# Patient Record
Sex: Male | Born: 1950 | Hispanic: Yes | Marital: Married | Smoking: Never smoker
Health system: Southern US, Community
[De-identification: ages and names within clinical notes are randomized; demographics above are authoritative.]

## PROBLEM LIST (undated history)

## (undated) DIAGNOSIS — C61 Malignant neoplasm of prostate: Secondary | ICD-10-CM

## (undated) DIAGNOSIS — I10 Essential (primary) hypertension: Secondary | ICD-10-CM

## (undated) DIAGNOSIS — I639 Cerebral infarction, unspecified: Secondary | ICD-10-CM

## (undated) DIAGNOSIS — E7800 Pure hypercholesterolemia, unspecified: Secondary | ICD-10-CM

## (undated) DIAGNOSIS — K635 Polyp of colon: Secondary | ICD-10-CM

## (undated) DIAGNOSIS — G935 Compression of brain: Secondary | ICD-10-CM

## (undated) DIAGNOSIS — K5790 Diverticulosis of intestine, part unspecified, without perforation or abscess without bleeding: Secondary | ICD-10-CM

## (undated) HISTORY — DX: Compression of brain: G93.5

## (undated) HISTORY — DX: Pure hypercholesterolemia, unspecified: E78.00

## (undated) HISTORY — DX: Diverticulosis of intestine, part unspecified, without perforation or abscess without bleeding: K57.90

## (undated) HISTORY — DX: Essential (primary) hypertension: I10

## (undated) HISTORY — DX: Polyp of colon: K63.5

## (undated) HISTORY — PX: POLYPECTOMY: SHX149

## (undated) HISTORY — PX: INGUINAL HERNIA REPAIR: SUR1180

---

## 2021-04-03 ENCOUNTER — Other Ambulatory Visit: Payer: Self-pay | Admitting: Otolaryngology

## 2021-04-03 DIAGNOSIS — J339 Nasal polyp, unspecified: Secondary | ICD-10-CM

## 2021-04-21 ENCOUNTER — Other Ambulatory Visit: Payer: Self-pay | Admitting: Urology

## 2021-04-21 ENCOUNTER — Other Ambulatory Visit: Payer: Self-pay | Admitting: Family Medicine

## 2021-04-21 ENCOUNTER — Other Ambulatory Visit (HOSPITAL_COMMUNITY): Payer: Self-pay | Admitting: Urology

## 2021-04-21 DIAGNOSIS — C61 Malignant neoplasm of prostate: Secondary | ICD-10-CM

## 2021-04-24 ENCOUNTER — Ambulatory Visit
Admission: RE | Admit: 2021-04-24 | Discharge: 2021-04-24 | Disposition: A | Discharge status: Home / Self Care | Payer: Self-pay | Source: Ambulatory Visit | Attending: Otolaryngology | Admitting: Otolaryngology

## 2021-04-24 DIAGNOSIS — J339 Nasal polyp, unspecified: Secondary | ICD-10-CM

## 2021-04-24 IMAGING — CT CT MAXILLOFACIAL W/O CM
1 series · 15 of 30 positions shown, 19 images · non-contrast
Comparison: None.

CLINICAL DATA: Nasal polyps, history of polyps surgery

EXAM:
CT MAXILLOFACIAL WITHOUT CONTRAST
TECHNIQUE: Multidetector CT images of the paranasal sinuses were obtained using
the standard protocol without intravenous contrast.

[Series 6: soft tissue · axial · 0.40mm/px · z∈[+170,+295]mm · 15 of 135 slices shown, 19 images]
[im 5/135  brain]
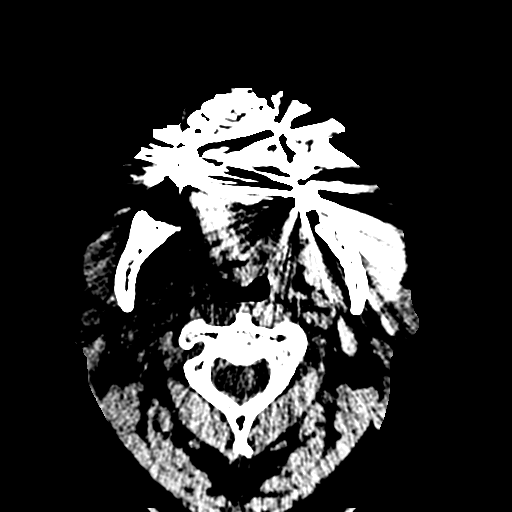
[im 5/135  bone]
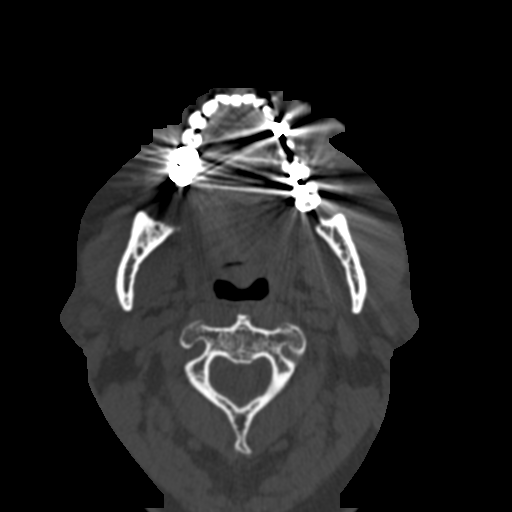
[im 14/135  bone]
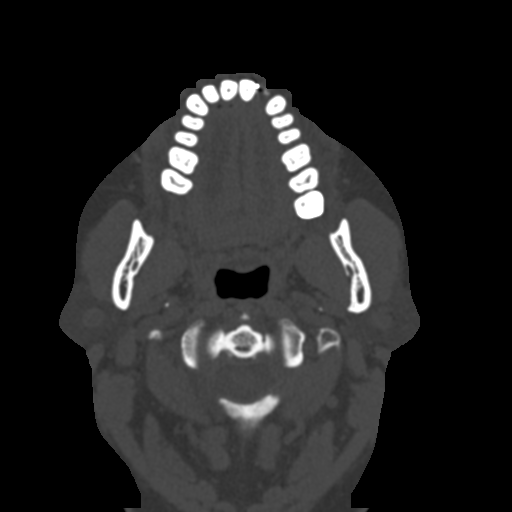
[im 24/135  bone]
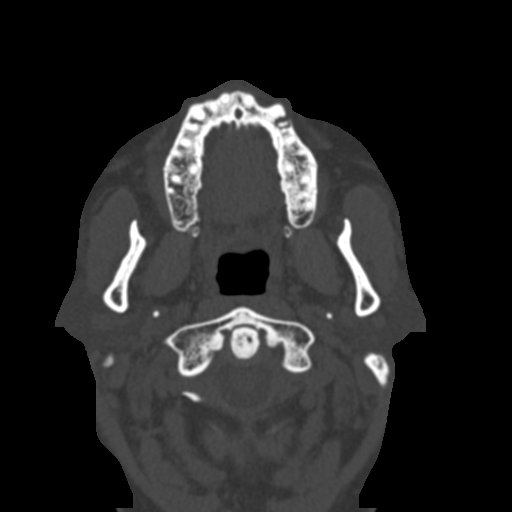
[im 33/135  bone]
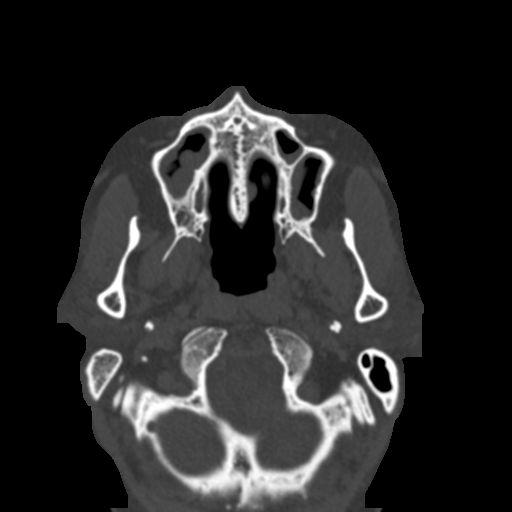
[im 42/135  brain]
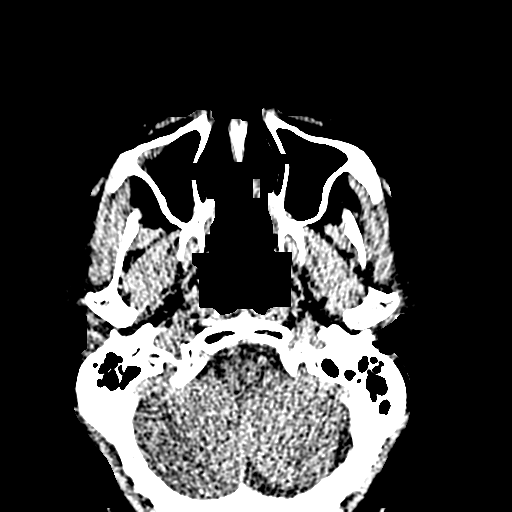
[im 42/135  bone]
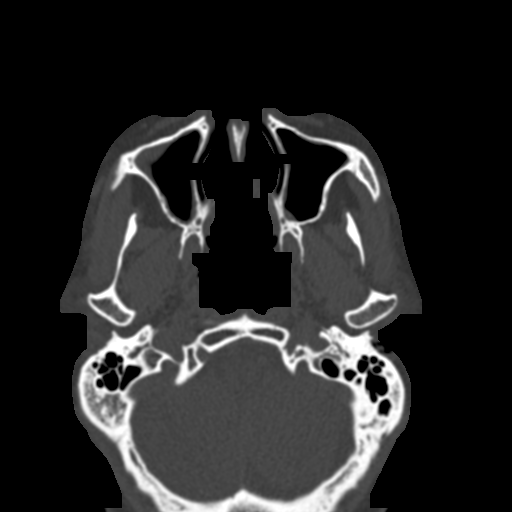
[im 51/135  bone]
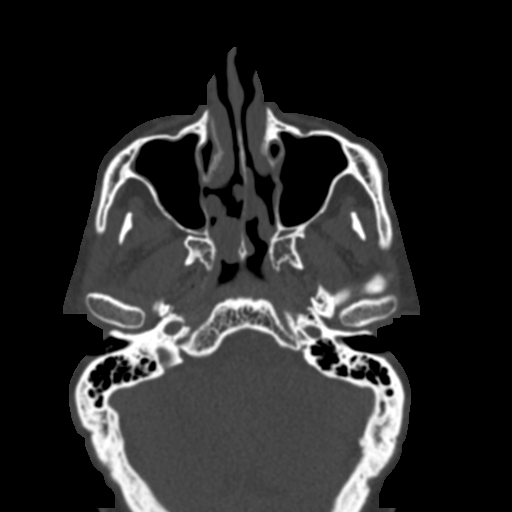
[im 61/135  bone]
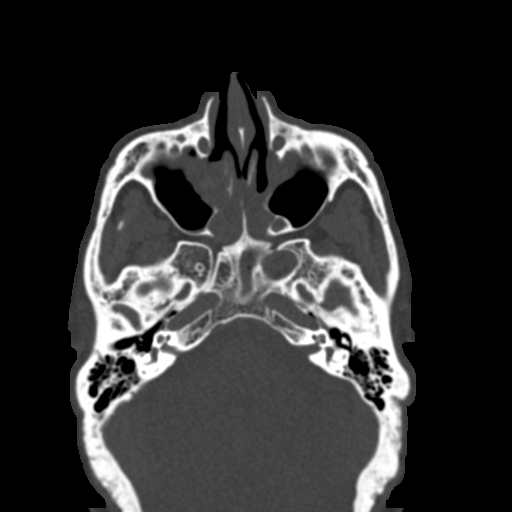
[im 70/135  bone]
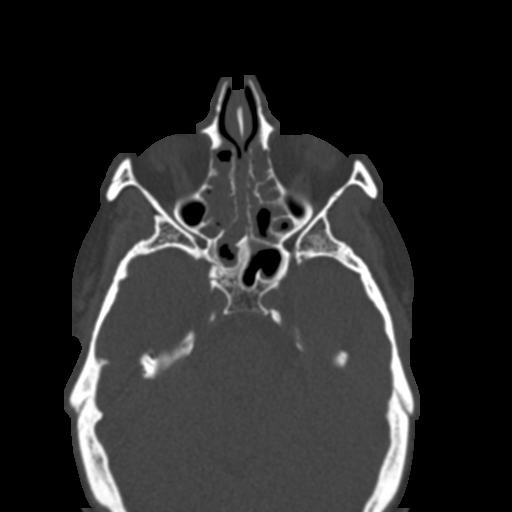
[im 74/135  brain]
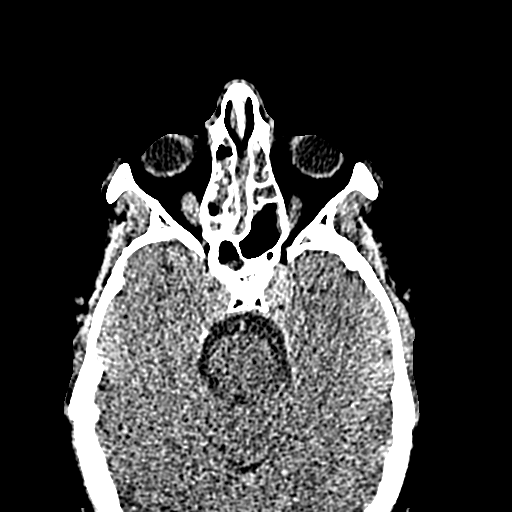
[im 74/135  bone]
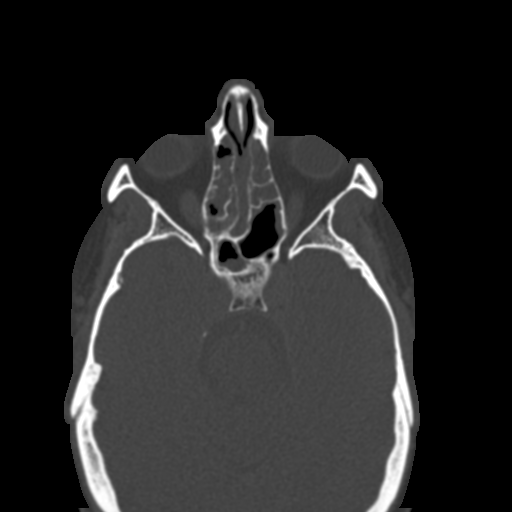
[im 84/135  bone]
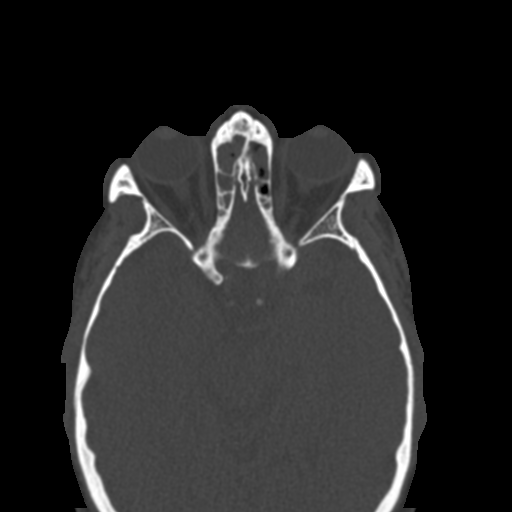
[im 93/135  bone]
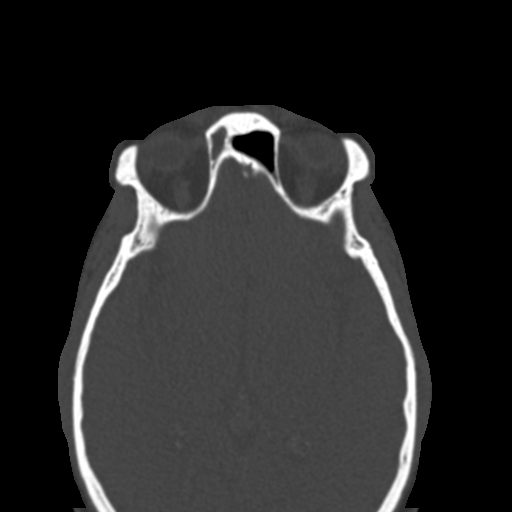
[im 102/135  bone]
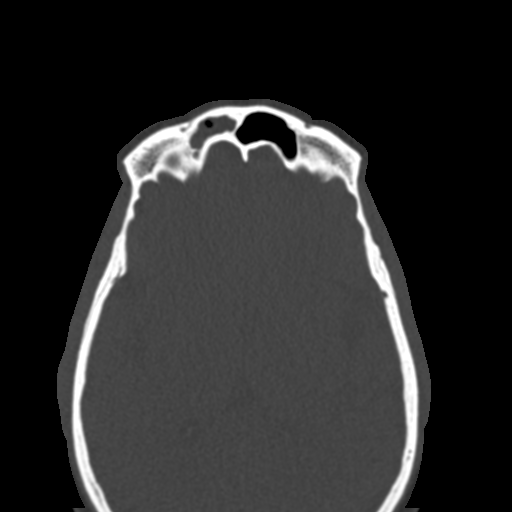
[im 111/135  brain]
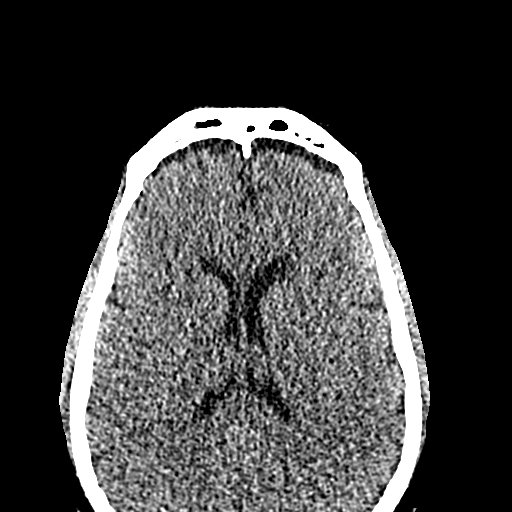
[im 111/135  bone]
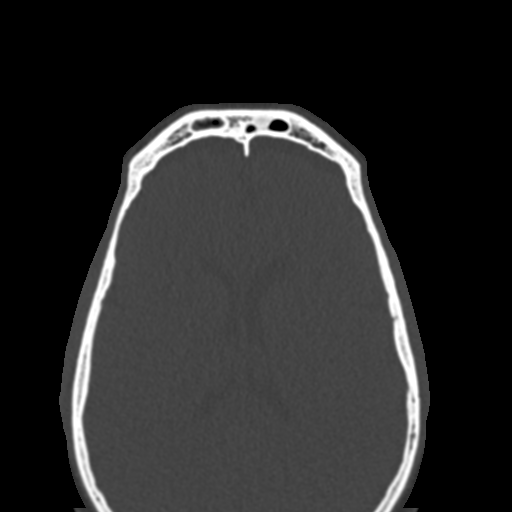
[im 121/135  bone]
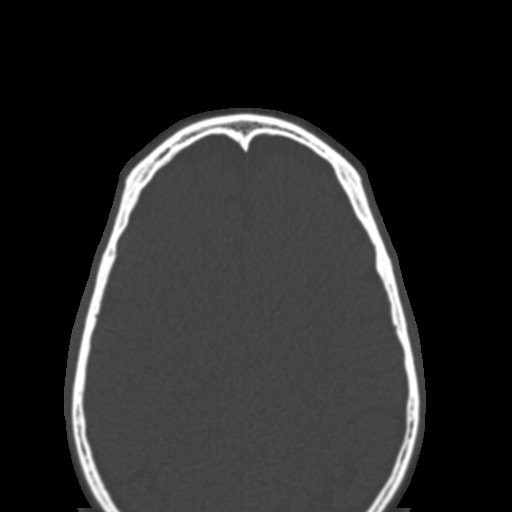
[im 130/135  bone]
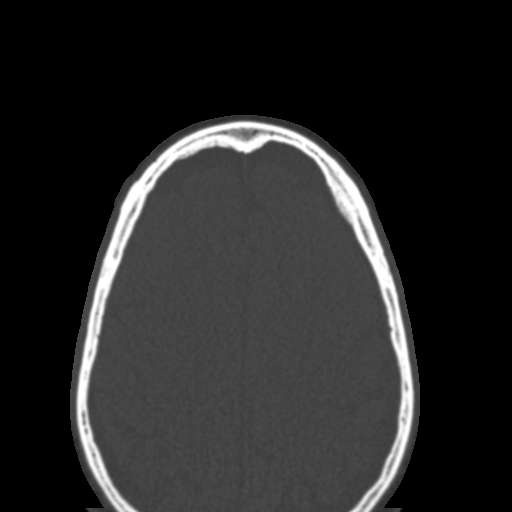

[15 of 30 positions shown; findings below may reference images not displayed]

FINDINGS: Paranasal sinuses:

Frontal: Near complete opacification of the right frontal sinus. The
left frontal sinus demonstrates minimal mucosal thickening
inferiorly. The right frontoethmoidal recess is occluded. The left
frontoethmoidal recess patent.

Ethmoid: Near complete opacification of the ethmoid air cells.

Maxillary: Status post bilateral maxillary antrostomies. Mucosal
thickening in the right greater than left maxillary sinus, with more
significant polypoid thickening at the right greater than left
antrostomy.

Sphenoid: Status post bilateral sphenoidotomy. Partial opacification
of the bilateral sphenoid sinuses. Occlusion of the bilateral
sphenoethmoidal recesses.

Right ostiomeatal unit: Status post antrostomy.  Occluded.

Left ostiomeatal unit: Status post antrostomy.  Minimally patent.

Nasal passages: Polypoid mucosal thickening in the nasal cavity. The
septum is grossly midline.

Anatomy: No pneumatization superior to anterior ethmoid notches.
Symmetric and intact olfactory grooves and fovea ethmoidalis, Keros
II (4-7mm). Presellar pneumatization pattern on the right. Sellar
pneumatization pattern on the left.

Other: Orbits and intracranial compartment are unremarkable. Visible
mastoid air cells are normally aerated.
IMPRESSION: Status post bilateral maxillary antrostomies and sphenoidotomy, with
occlusion of the right antrostomy and bilateral sphenoidotomies, as
well as varying degrees of sinus disease in the paranasal sinuses,
as described above.

## 2021-05-01 ENCOUNTER — Other Ambulatory Visit: Payer: Self-pay

## 2021-05-01 ENCOUNTER — Ambulatory Visit (HOSPITAL_COMMUNITY)
Admission: RE | Admit: 2021-05-01 | Discharge: 2021-05-01 | Disposition: A | Discharge status: Home / Self Care | Payer: Medicare Other | Source: Ambulatory Visit | Attending: Urology | Admitting: Urology

## 2021-05-01 ENCOUNTER — Encounter (HOSPITAL_COMMUNITY): Payer: Self-pay

## 2021-05-01 DIAGNOSIS — C61 Malignant neoplasm of prostate: Secondary | ICD-10-CM

## 2021-05-12 ENCOUNTER — Other Ambulatory Visit: Payer: Self-pay

## 2021-05-12 ENCOUNTER — Ambulatory Visit (HOSPITAL_COMMUNITY)
Admission: RE | Admit: 2021-05-12 | Discharge: 2021-05-12 | Disposition: A | Discharge status: Home / Self Care | Payer: Medicare Other | Source: Ambulatory Visit | Attending: Urology | Admitting: Urology

## 2021-05-12 DIAGNOSIS — C61 Malignant neoplasm of prostate: Secondary | ICD-10-CM | POA: Diagnosis present

## 2021-05-12 IMAGING — MR MR PROSTATE WO/W CM
13 series · 48 of 48 positions shown · IV contrast (gadavist)
Comparison: None.

CLINICAL DATA: History of prostate cancer. Prior uro lift
procedure.

EXAM:
MR PROSTATE WITHOUT AND WITH CONTRAST
TECHNIQUE: Multiplanar multisequence MRI images were obtained of the pelvis
centered about the prostate. Pre and post contrast images were
obtained.
CONTRAST:  7mL GADAVIST GADOBUTROL 1 MMOL/ML IV SOLN

[Series 2: loc · axial · 8.0mm · 0.84mm/px · 1 of 23 slices shown]
[im 1/23]
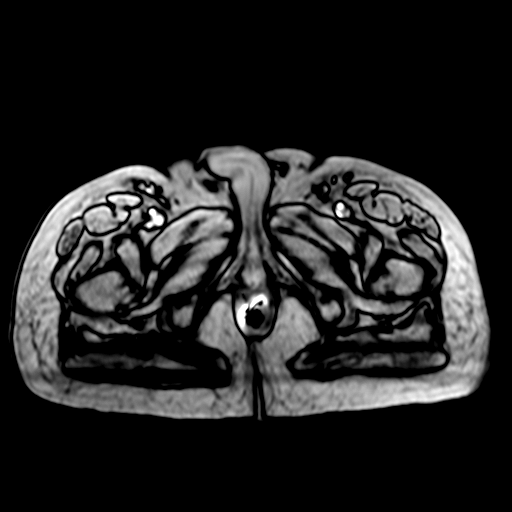

[Series 3: T1 · axial · 5.0mm · 1.19mm/px · 1 of 64 slices shown (1 of 2)]
[im 1/64]
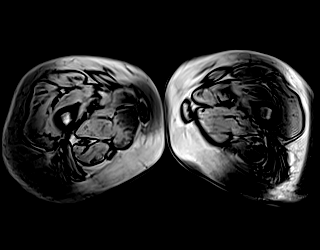

[Series 4: T1 · axial · 5.0mm · 1.19mm/px · 1 of 64 slices shown (2 of 2)]
[im 1/64]
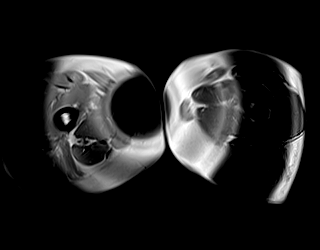

[Series 5: T2 · axial · 3.0mm · 0.47mm/px · 1 of 32 slices shown (1 of 3)]
[im 1/32]
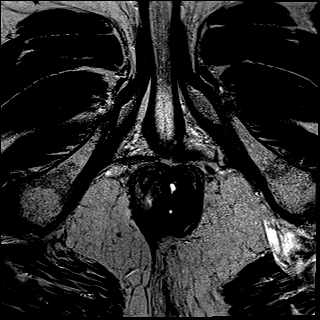

[Series 6: T2 · coronal · 3.0mm · 0.47mm/px · 1 of 27 slices shown (2 of 3)]
[im 1/27]
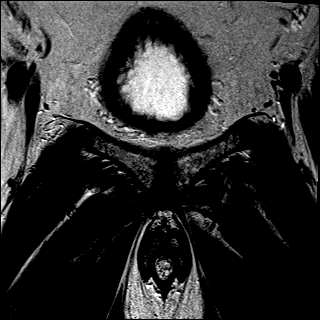

[Series 7: T2 · axial · 1.0mm · 1.00mm/px · z∈[-42,+51]mm · 3 of 96 slices shown (3 of 3)]
[im 1/96]
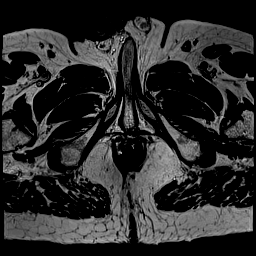
[im 48/96]
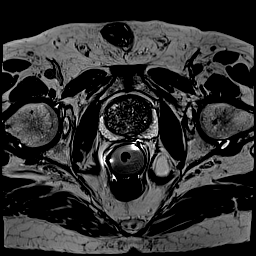
[im 96/96]
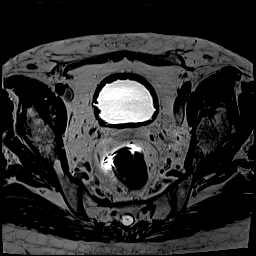

[Series 8: ep2d_diff_b100_500_800_tra_endo**_tracew_dfc_mix · axial · 3.0mm · 1.60mm/px · z∈[-33,+56]mm · 2 of 79 slices shown]
[im 1/79]
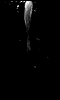
[im 79/79]
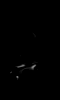

[Series 9: ep2d_diff_b100_500_800_tra_endo**_adc_dfc_mix · axial · 3.0mm · 1.60mm/px · 1 of 32 slices shown]
[im 1/32]
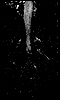

[Series 10: ep2d_diff_b100_500_800_tra_endo**_calc_bval_dfc_mix · axial · 3.0mm · 1.60mm/px · 1 of 32 slices shown]
[im 1/32]
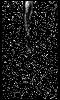

[Series 11: ep2d_diff_bvalue (id) · axial · 3.0mm · 1.60mm/px · 1 of 32 slices shown]
[im 1/32]
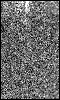

[Series 12: axial multiphase · axial · 3.0mm · 0.98mm/px · z∈[-28,+59]mm · 16 of 600 slices shown]
[im 1/600]
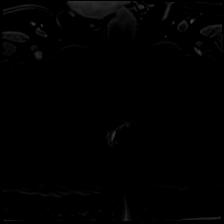
[im 40/600]
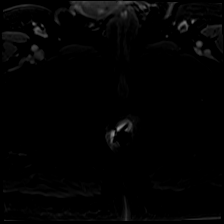
[im 80/600]
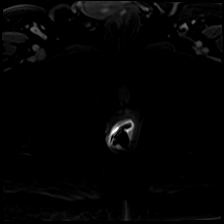
[im 120/600]
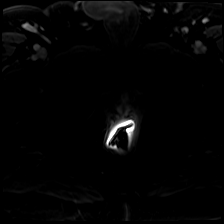
[im 160/600]
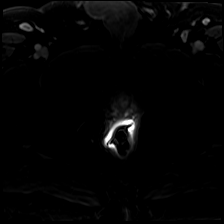
[im 200/600]
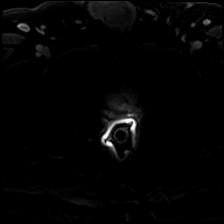
[im 240/600]
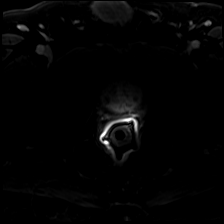
[im 280/600]
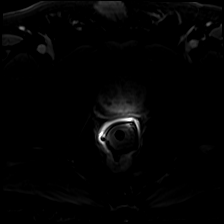
[im 320/600]
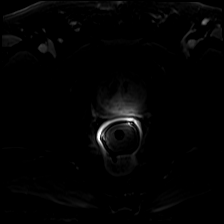
[im 360/600]
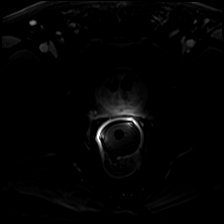
[im 400/600]
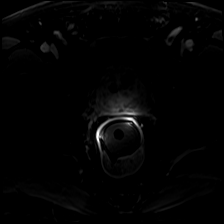
[im 440/600]
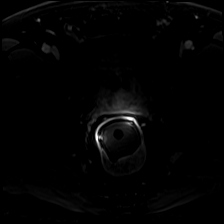
[im 480/600]
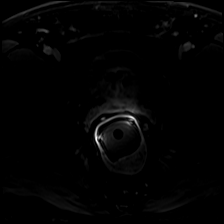
[im 520/600]
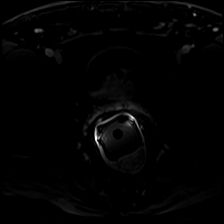
[im 560/600]
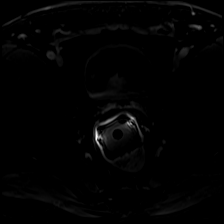
[im 600/600]
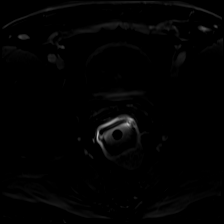

[Series 13: axial multiphase_sub · axial · 3.0mm · 0.98mm/px · z∈[-28,+59]mm · 15 of 542 slices shown]
[im 1/542]
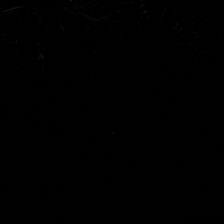
[im 39/542]
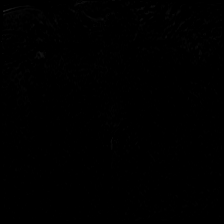
[im 78/542]
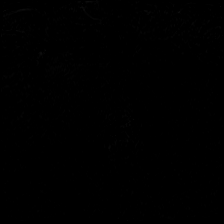
[im 116/542]
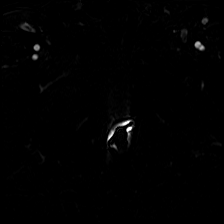
[im 155/542]
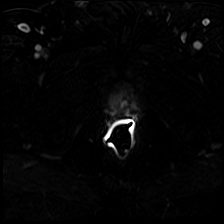
[im 194/542]
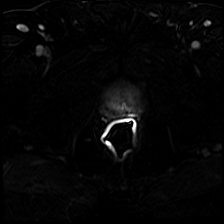
[im 232/542]
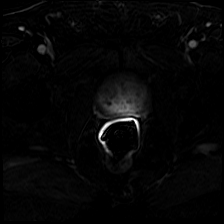
[im 271/542]
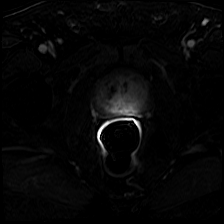
[im 310/542]
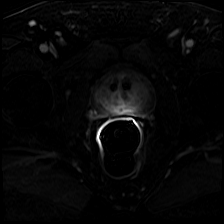
[im 348/542]
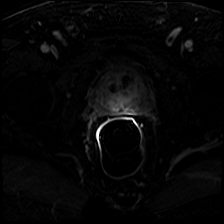
[im 387/542]
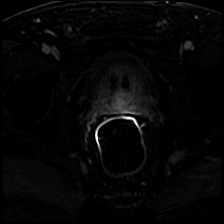
[im 426/542]
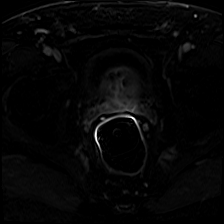
[im 464/542]
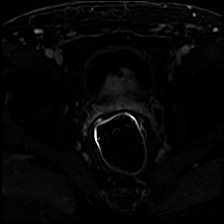
[im 503/542]
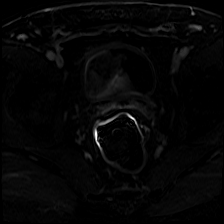
[im 542/542]
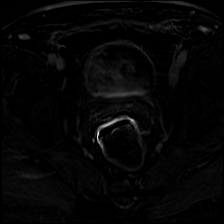

[Series 15: iliac crest thru · axial · 2.5mm · 1.19mm/px · z∈[-96,+222]mm · 4 of 128 slices shown]
[im 1/128]
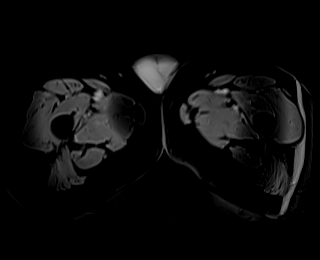
[im 43/128]
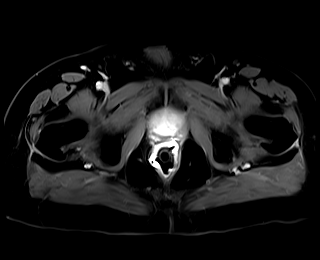
[im 85/128]
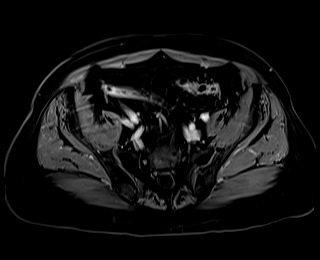
[im 128/128]
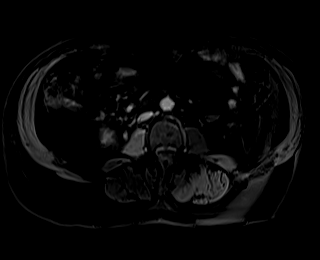

[48 of 48 positions shown; findings below may reference images not displayed]

FINDINGS: Prostate: Normal high signal intensity within the peripheral zone on
T2 weighted imaging (image series 5). No focal lesion present. No
restricted diffusion within the peripheral zone (series 9).

The transitional zone is enlarged by well capsulated nodules without
suspicious imaging characteristics on T2 weighted imaging.

There is artifact within the central transition related to the your
urolift apparatus.

Volume: 6.4 x 4.5 by 5.3 cm (volume = 80 cm^3)

Transcapsular spread:  Absent

Seminal vesicle involvement: Absent

Neurovascular bundle involvement: Absent

Pelvic adenopathy: Absent

Bone metastasis: Absent

Other findings: None
IMPRESSION: 1. No high-grade carcinoma in the peripheral zone.  PI-RADS: 1
2. Enlarged nodular transitional zone most consistent with benign
prostate hypertrophy. PI-RADS: 2

## 2021-05-12 MED ORDER — GADOBUTROL 1 MMOL/ML IV SOLN
7.0000 mL | Freq: Once | INTRAVENOUS | Status: AC | PRN
  Administered 2021-05-12: 14:00:00 7 mL via INTRAVENOUS

## 2021-06-06 ENCOUNTER — Encounter: Payer: Self-pay | Admitting: Neurology

## 2021-07-11 ENCOUNTER — Ambulatory Visit (INDEPENDENT_AMBULATORY_CARE_PROVIDER_SITE_OTHER): Payer: Medicare Other | Admitting: Cardiovascular Disease

## 2021-07-11 ENCOUNTER — Encounter: Payer: Self-pay | Admitting: Cardiovascular Disease

## 2021-07-11 ENCOUNTER — Other Ambulatory Visit: Payer: Self-pay

## 2021-07-11 DIAGNOSIS — E785 Hyperlipidemia, unspecified: Secondary | ICD-10-CM | POA: Insufficient documentation

## 2021-07-11 DIAGNOSIS — I10 Essential (primary) hypertension: Secondary | ICD-10-CM

## 2021-07-11 DIAGNOSIS — E782 Mixed hyperlipidemia: Secondary | ICD-10-CM | POA: Diagnosis not present

## 2021-07-11 DIAGNOSIS — Z8673 Personal history of transient ischemic attack (TIA), and cerebral infarction without residual deficits: Secondary | ICD-10-CM | POA: Insufficient documentation

## 2021-07-11 MED ORDER — ROSUVASTATIN CALCIUM 40 MG PO TABS: 40.0000 mg | ORAL_TABLET | Freq: Every day | ORAL | 3 refills | Status: AC

## 2021-07-11 NOTE — Progress Notes (Signed)
07/11/2021 Brian Bush   10-13-1950  [id number]  Primary Physician Cipriano Mile, NP Primary Cardiologist: Lorretta Harp MD [name], [place]  HPI:  Brian Bush is a 71 y.o. mildly overweight Latino male father of 39, grandfather of 4 grandchildren he was referred by Cipriano Mile, NP because of hypertension and prior stroke.  He is retired Sales promotion account executive.  He is also a Jehovah's Witness.  He has a history of treated hypertension and hyperlipidemia.  He had a prior stroke in 2016 without neurologic residual.  He is never had a heart attack.  There is no family history of heart disease.  He denies chest pain or shortness of breath.  He also has remote history of prostate cancer.   Current Meds  Medication Sig   ASPIRIN 81 PO Take 81 mg by mouth daily.   CARBAMAZEPINE PO Take 1 tablet by mouth daily.   propranolol (INDERAL) 80 MG tablet Take 80 mg by mouth daily.   rosuvastatin (CRESTOR) 20 MG tablet Take 20 mg by mouth daily.   tamsulosin (FLOMAX) 0.4 MG CAPS capsule Take 0.4 mg by mouth daily.     No Known Allergies  Social History   Socioeconomic History   Marital status: Married    Spouse name: Not on file   Number of children: Not on file   Years of education: Not on file   Highest education level: Not on file  Occupational History   Not on file  Tobacco Use   Smoking status: Never   Smokeless tobacco: Never  Substance and Sexual Activity   Alcohol use: Not on file   Drug use: Not on file   Sexual activity: Not on file  Other Topics Concern   Not on file  Social History Narrative   Not on file   Social Determinants of Health   Financial Resource Strain: Not on file  Food Insecurity: Not on file  Transportation Needs: Not on file  Physical Activity: Not on file  Stress: Not on file  Social Connections: Not on file  Intimate Partner Violence: Not on file     Review of Systems: General: negative for chills, fever, night sweats or weight changes.   Cardiovascular: negative for chest pain, dyspnea on exertion, edema, orthopnea, palpitations, paroxysmal nocturnal dyspnea or shortness of breath Dermatological: negative for rash Respiratory: negative for cough or wheezing Urologic: negative for hematuria Abdominal: negative for nausea, vomiting, diarrhea, bright red blood per rectum, melena, or hematemesis Neurologic: negative for visual changes, syncope, or dizziness All other systems reviewed and are otherwise negative except as noted above.    Blood pressure (!) 146/84, pulse 60, height 5\' 11"  (1.803 m), weight 205 lb (93 kg).  General appearance: alert and no distress Neck: no adenopathy, no carotid bruit, no JVD, supple, symmetrical, trachea midline, and thyroid not enlarged, symmetric, no tenderness/mass/nodules Lungs: clear to auscultation bilaterally Heart: regular rate and rhythm, S1, S2 normal, no murmur, click, rub or gallop Extremities: extremities normal, atraumatic, no cyanosis or edema Pulses: 2+ and symmetric Skin: Skin color, texture, turgor normal. No rashes or lesions Neurologic: Grossly normal  EKG sinus rhythm at 60 without ST or T wave changes.  There was RSR prime in lead V1 and V2 suggesting RV conduction delay.  I personally reviewed this EKG.  ASSESSMENT AND PLAN:   Remote history of stroke Prior history of remote stroke back in 2016 without neurologic residual  Hyperlipidemia History of hyperlipidemia on Crestor 20 mg a day  with lipid profile performed 07/11/2020 revealing a total cholesterol of 261, LDL 160 and HDL 72.  I am going to increase his Crestor from 20 to 40 mg a day and we will recheck a lipid liver profile in 3 months  Essential hypertension History of essential hypertension on propranolol with blood pressure measured today 146/84.     Lorretta Harp MD FACP,FACC,FAHA, [facility] 07/11/2021 2:21 PM

## 2021-07-11 NOTE — Assessment & Plan Note (Signed)
Prior history of remote stroke back in 2016 without neurologic residual

## 2021-07-11 NOTE — Assessment & Plan Note (Signed)
History of hyperlipidemia on Crestor 20 mg a day with lipid profile performed 07/11/2020 revealing a total cholesterol of 261, LDL 160 and HDL 72.  I am going to increase his Crestor from 20 to 40 mg a day and we will recheck a lipid liver profile in 3 months

## 2021-07-11 NOTE — Assessment & Plan Note (Signed)
History of essential hypertension on propranolol with blood pressure measured today 146/84.

## 2021-07-11 NOTE — Patient Instructions (Signed)
Medication Instructions:   -Increase rosuvastatin (crestor) to 40mg  once daily.   *If you need a refill on your cardiac medications before your next appointment, please call your pharmacy*   Lab Work: Your physician recommends that you return for lab work in: 3 months for FASTING lipid/liver.  If you have labs (blood work) drawn today and your tests are completely normal, you will receive your results only by: [place] (if you have MyChart) OR A paper copy in the mail If you have any lab test that is abnormal or we need to change your treatment, we will call you to review the results.   Follow-Up: At [facility], you and your health needs are our priority.  As part of our continuing mission to provide you with exceptional heart care, we have created designated Provider Care Teams.  These Care Teams include your primary Cardiologist (physician) and Advanced Practice Providers (APPs -  Physician Assistants and Nurse Practitioners) who all work together to provide you with the care you need, when you need it.  We recommend signing up for the patient portal called "MyChart".  Sign up information is provided on this After Visit Summary.  MyChart is used to connect with patients for Virtual Visits (Telemedicine).  Patients are able to view lab/test results, encounter notes, upcoming appointments, etc.  Non-urgent messages can be sent to your provider as well.   To learn more about what you can do with MyChart, go to [email or web address].    Your next appointment:   12 month(s)  The format for your next appointment:   In Person  Provider:   Quay Burow, MD

## 2021-07-17 ENCOUNTER — Encounter: Payer: Self-pay | Admitting: Neurology

## 2021-07-17 ENCOUNTER — Ambulatory Visit (INDEPENDENT_AMBULATORY_CARE_PROVIDER_SITE_OTHER): Payer: Medicare Other | Admitting: Neurology

## 2021-07-17 ENCOUNTER — Other Ambulatory Visit: Payer: Self-pay

## 2021-07-17 VITALS — BP 130/79 | HR 65 | Ht 71.0 in | Wt 203.0 lb

## 2021-07-17 DIAGNOSIS — G500 Trigeminal neuralgia: Secondary | ICD-10-CM | POA: Diagnosis not present

## 2021-07-17 MED ORDER — CARBAMAZEPINE 200 MG PO TABS: 200.0000 mg | ORAL_TABLET | Freq: Two times a day (BID) | ORAL | 3 refills | Status: DC

## 2021-07-17 MED ORDER — CARBAMAZEPINE ER 100 MG PO CP12
100.0000 mg | ORAL_CAPSULE | Freq: Two times a day (BID) | ORAL | 3 refills | Status: DC
Start: 2021-07-17 — End: 2022-03-19

## 2021-07-17 NOTE — Progress Notes (Signed)
[facility] Clinic Note - Initial Visit   Date: 07/17/21  Brian Bush MRN: [id number] DOB: 10-Sep-1950   Dear Dr. Manuella Ghazi:  Thank you for your kind referral of Brian Bush for consultation of trigeminal neuralgia. Although his history is well known to you, please allow [place] to reiterate it for the purpose of our medical record. The patient was accompanied to the clinic by wife and Spanish interpretor who also provides collateral information.     History of Present Illness: Brian Bush is a 71 y.o. right-handed male with stroke (2016, no residual deficits), hyperlipidemia, and prostate cancer (2022) presenting for evaluation of right facial pain.   He has history of right trigeminal neuralgia for the past 25 years.  Symptoms were being management by his PCP with carbamazepine 400-600mg /d.  He has not seen a neurologist for this.  Pain is described as strong, electrical pain over the right forehead, cheek and into the teeth.  Pain does not cross to the left side.  It lasts about a minute and worse at night time.  It is triggered by sun, light pressure, such as wearing a mask.  He has noticed that since he stopped working, pain occurs less frequently.  Over the years, he has episodic spells pain which has been controlled on carbamazepine 200mg  BID, however last week, pain intensified and he increased it to 300mg  BID, which has helped.  He has not been on any other medication for pain.   Out-side paper records, electronic medical record, and images have been reviewed where available and summarized as:  CT maxillofacial wo contrast 04/25/2021: Status post bilateral maxillary antrostomies and sphenoidotomy, with occlusion of the right antrostomy and bilateral sphenoidotomies, as well as varying degrees of sinus disease in the paranasal sinuses, as described above.  Past Medical History: Stroke (2016) Prostate cancer (2022) Hypertension Hyperlipidemia  Past surgical  history: Bilateral maxillary antrostomies and sphenoidotomy    Medications:  Outpatient Encounter Medications as of 07/17/2021  Medication Sig   ASPIRIN 81 PO Take 81 mg by mouth daily.   carbamazepine (CARBATROL) 100 MG 12 hr capsule Take 1 capsule (100 mg total) by mouth 2 (two) times daily.   carbamazepine (TEGRETOL) 200 MG tablet Take 1 tablet (200 mg total) by mouth in the morning and at bedtime.   propranolol (INDERAL) 80 MG tablet Take 80 mg by mouth daily.   rosuvastatin (CRESTOR) 40 MG tablet Take 1 tablet (40 mg total) by mouth daily.   tamsulosin (FLOMAX) 0.4 MG CAPS capsule Take 0.4 mg by mouth daily.   [DISCONTINUED] CARBAMAZEPINE PO Take 1 tablet by mouth daily.   No facility-administered encounter medications on file as of 07/17/2021.    Allergies:  Allergies  Allergen Reactions   Whole Blood     Pt is a Jehovah's witness and wishes to not receive blood products.     Family History: History reviewed. No pertinent family history.  Social History: Social History   Tobacco Use   Smoking status: Never   Smokeless tobacco: Never   Social History   Social History Narrative   Right Handed   Lives in a one story apartment    Vital Signs:  BP 130/79    Pulse 65    Ht 5\' 11"  (1.803 m)    Wt 203 lb (92.1 kg)    SpO2 99%    BMI 28.31 kg/m    Neurological Exam: MENTAL STATUS including orientation to time, place, person, recent and remote memory, attention span  and concentration, language, and fund of knowledge is normal.  Speech is not dysarthric.  CRANIAL NERVES: II:  No visual field defects.     III-IV-VI: Pupils equal round and reactive to light.  Normal conjugate, extra-ocular eye movements in all directions of gaze.  No nystagmus.  No ptosis.   V:  Normal facial sensation.    VII:  Normal facial symmetry and movements.   VIII:  Normal hearing and vestibular function.   IX-X:  Normal palatal movement.   XI:  Normal shoulder shrug and head rotation.   XII:   Normal tongue strength and range of motion, no deviation or fasciculation.  MOTOR:  Motor strength is 5/5 throughout.  No atrophy, fasciculations or abnormal movements.  No pronator drift.   MSRs:  Right        Left                  brachioradialis 2+  2+  biceps 2+  2+  triceps 2+  2+  patellar 2+  2+  ankle jerk 2+  2+  Hoffman no  no  plantar response down  down   SENSORY:  Normal and symmetric perception of light touch, pinprick, vibration, and proprioception.  Romberg's sign absent.   COORDINATION/GAIT: Normal finger-to- nose-finger.  Intact rapid alternating movements bilaterally.  Gait narrow based and stable. Tandem and stressed gait intact.    IMPRESSION: Right trigeminal neuralgia.  He has spells of pain over the past 25 years, which has been controlled on carbamazepine 400mg  - 600mg /d in divided doses.  Over the past week, pain intensified and he has been taking carbamazepine 300mg  BID, which can be continued.  Refills were provided for both carbamazepine 200mg  and 100mg  tablets so that he may taper the dose when pain is better controlled. He has never had imaging of the trigeminal nerve, so MRI brain wwo contrast - trigeminal nerve protocol will be ordered.  Return to clinic in 8 months   Thank you for allowing me to participate in patient's care.  If I can answer any additional questions, I would be pleased to do so.    Sincerely,    Aniah Pauli K. Posey Pronto, DO

## 2021-07-17 NOTE — Patient Instructions (Addendum)
MRI brain to evaluate the right trigeminal nerve  Continue carbamazepine 300mg  twice daily.  Once pain is better controlled, you can reduce to carbamazepine 200mg  twice daily.  Return to clinic in 8 months

## 2021-07-26 ENCOUNTER — Other Ambulatory Visit: Payer: Self-pay | Admitting: Otolaryngology

## 2021-08-07 ENCOUNTER — Ambulatory Visit
Admission: RE | Admit: 2021-08-07 | Discharge: 2021-08-07 | Disposition: A | Discharge status: Home / Self Care | Payer: Medicare Other | Source: Ambulatory Visit | Attending: Neurology | Admitting: Neurology

## 2021-08-07 DIAGNOSIS — G500 Trigeminal neuralgia: Secondary | ICD-10-CM

## 2021-08-07 IMAGING — MR MR FACE/TRIGEMINAL WO/W CM
5 of 8 series · 24 of 48 positions shown · IV contrast (19ML multihance)
Comparison: No pertinent prior exam.

CLINICAL DATA: Trigeminal neuralgia. Right facial pain 25 years.
Prostate cancer

EXAM:
MRI FACE TRIGEMINAL WITHOUT AND WITH CONTRAST
TECHNIQUE: Multiplanar, multi-echo pulse sequences of the face and surrounding
structures, including thin-slice imaging of the trigeminal nerves,
were acquired before and after intravenous contrast administration.
CONTRAST:  19mL MULTIHANCE GADOBENATE DIMEGLUMINE 529 MG/ML IV SOLN

[Series 2: T1 · sagittal · 3.0mm · 0.35mm/px · 4 of 40 slices shown (1 of 3)]
[im 1/40]
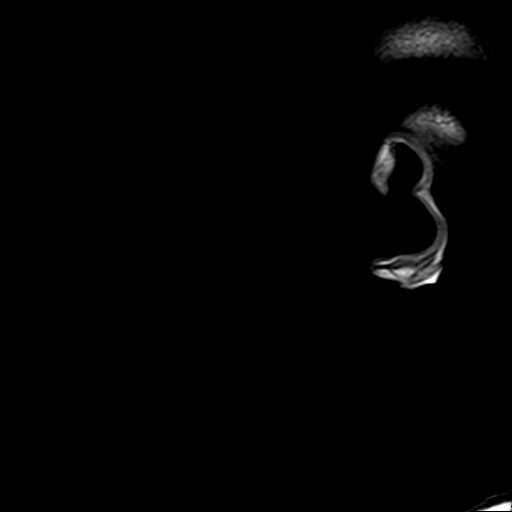
[im 14/40]
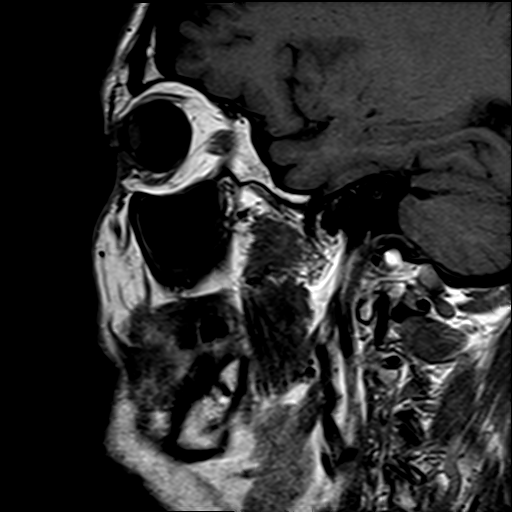
[im 27/40]
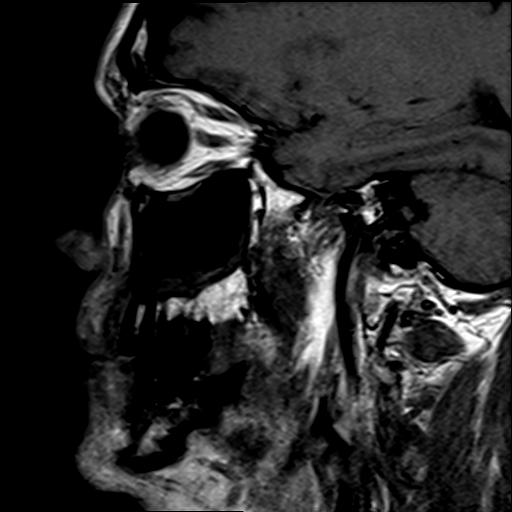
[im 40/40]
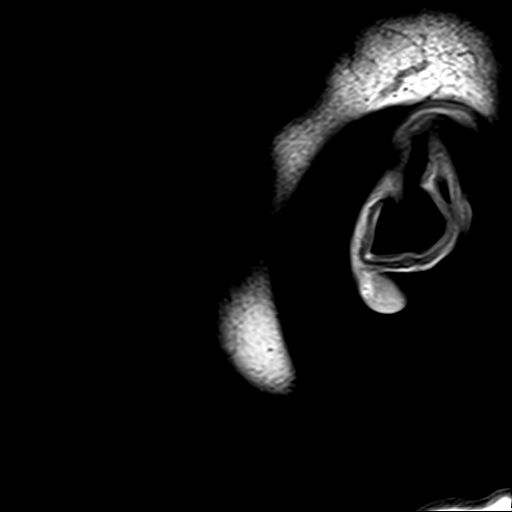

[Series 3: T2 · coronal · 3.0mm · 0.35mm/px · 6 of 45 slices shown]
[im 1/45]
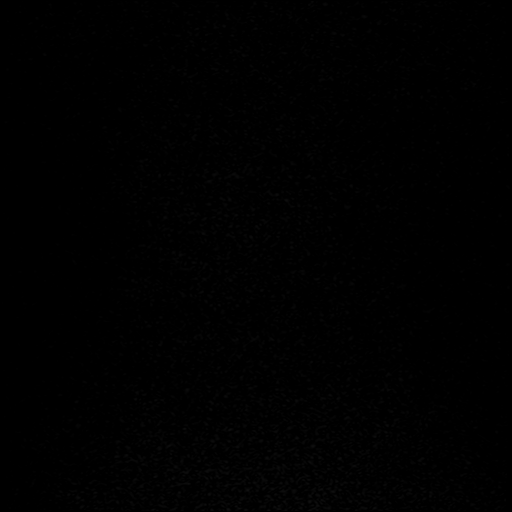
[im 9/45]
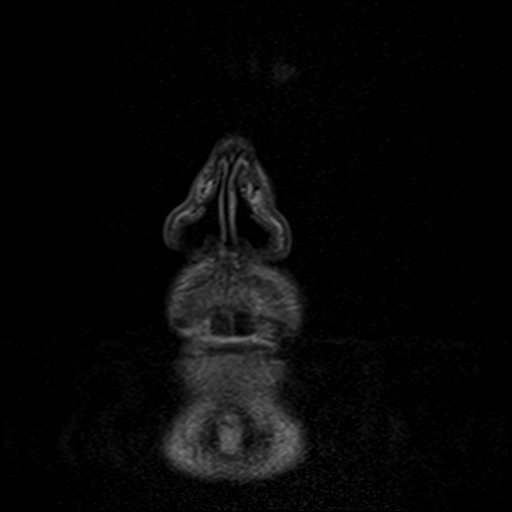
[im 18/45]
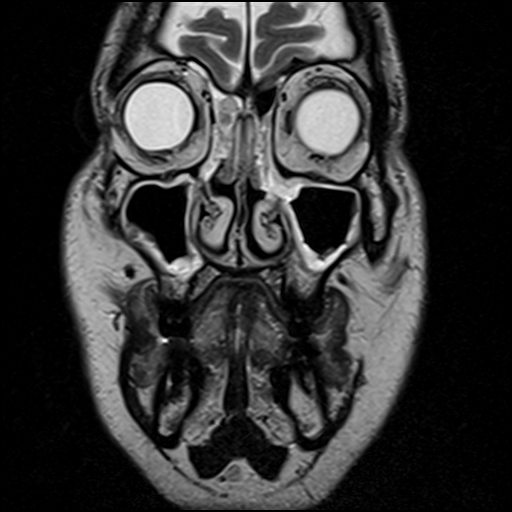
[im 27/45]
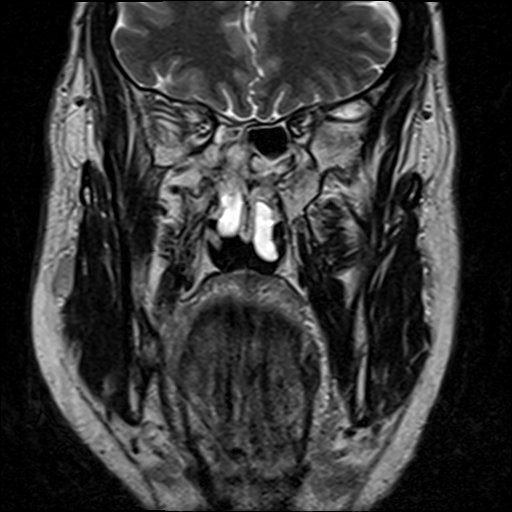
[im 36/45]
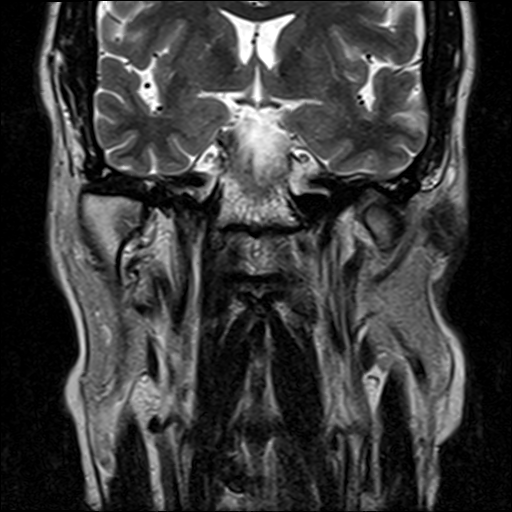
[im 45/45]
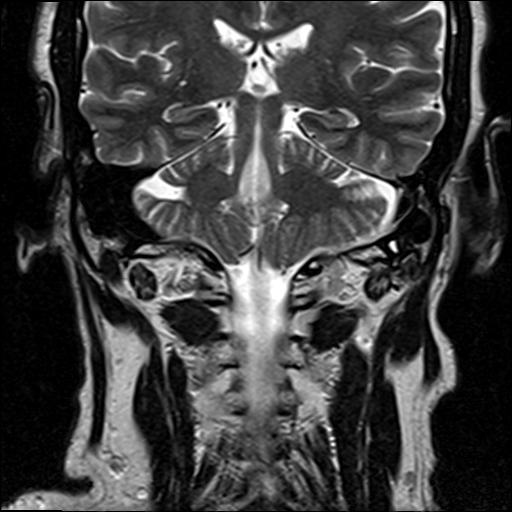

[Series 6: T1 · coronal · 3.0mm · 0.35mm/px · 6 of 46 slices shown (2 of 3)]
[im 1/46]
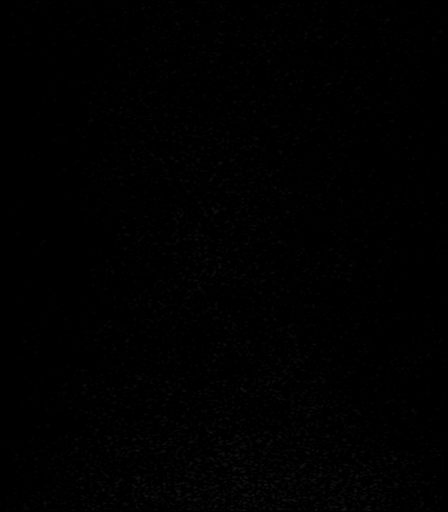
[im 10/46]
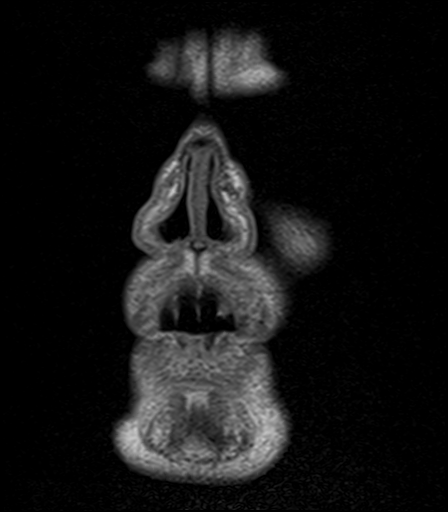
[im 19/46]
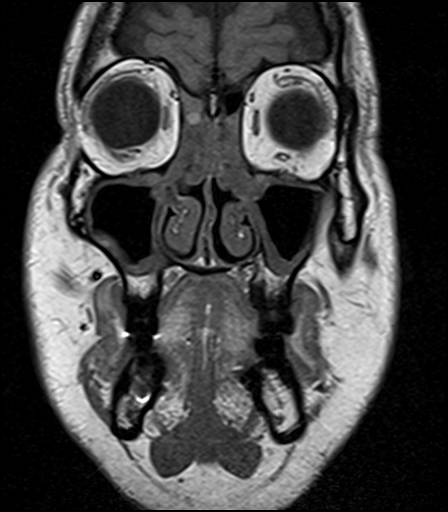
[im 28/46]
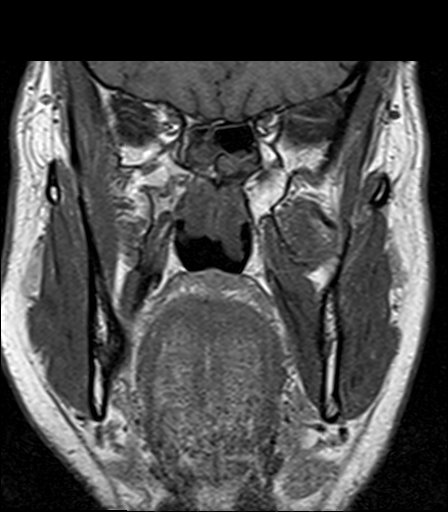
[im 37/46]
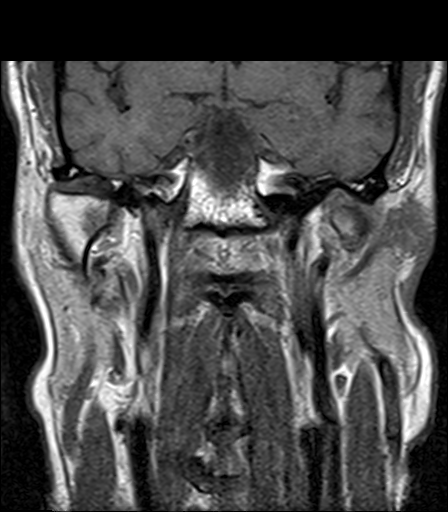
[im 46/46]
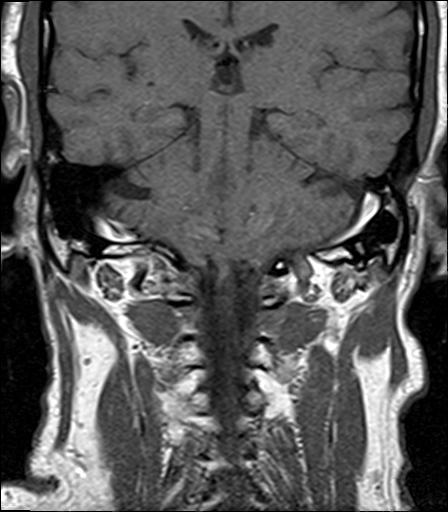

[Series 7: T1 · axial · 3.0mm · 0.50mm/px · z∈[-149,-4]mm · 6 of 45 slices shown (3 of 3)]
[im 1/45]
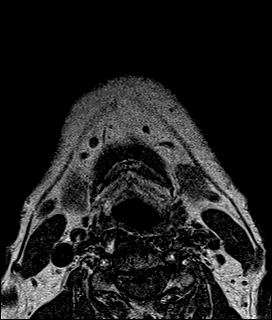
[im 9/45]
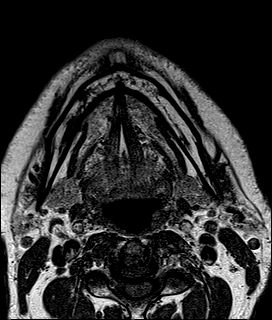
[im 18/45]
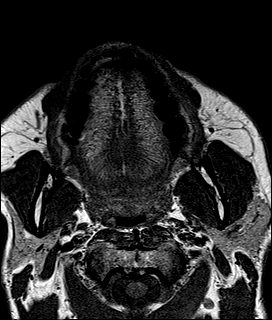
[im 27/45]
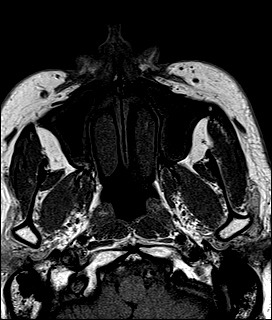
[im 36/45]
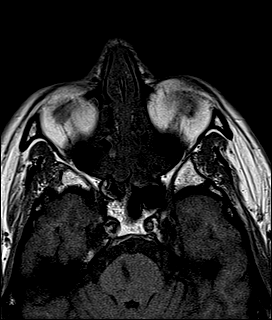
[im 45/45]
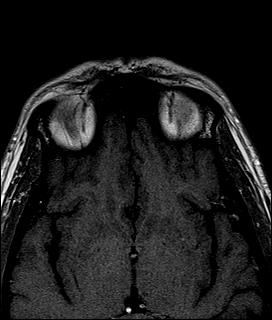

[Series 8: T1 fat-sat · coronal · 3.0mm · 0.35mm/px · 2 of 46 slices shown]
[im 1/46]
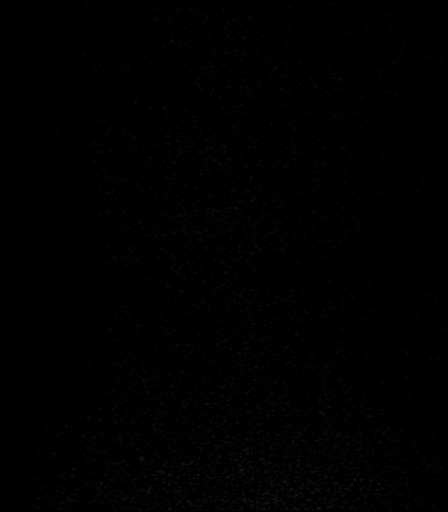
[im 10/46]
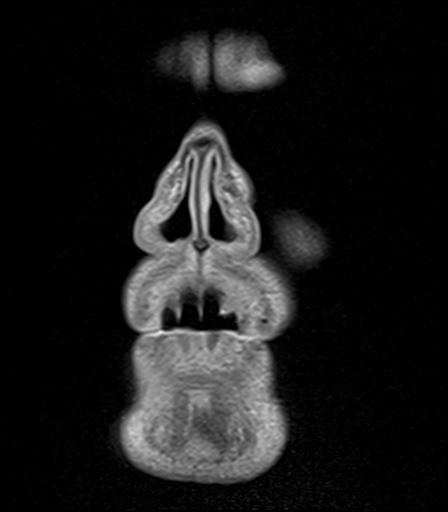

[24 of 48 positions shown; findings below may reference images not displayed]

FINDINGS: Limited intracranial/Trigeminal nerves: Atrophy of the right
trigeminal nerve in the cisternal segment. See coronal image 7,
series 3 for example, and axial image 39 series 5. No associated
mass lesion. Left trigeminal nerve normal in caliber. No vascular
impingement. Trigeminal ganglion on the right also mildly atrophic.
Cavernous sinus normal.

Vascular: Normal arterial flow voids. No vascular impingement on the
trigeminal nerve.

Sinuses/Orbits: Moderate mucosal edema throughout the paranasal
sinuses. Normal orbit bilaterally.

Soft tissues: Negative for soft tissue mass

Osseous: No skull base lesion.  Normal skeletal structures.

Other: Moderately large chronic infarct right paramedian pons.
IMPRESSION: 1. Atrophy of the right trigeminal nerve in the cisternal segment
and trigeminal ganglion. No mass lesion. No vascular impingement.
2. Chronic infarct right paramedian pons
3. Moderate paranasal sinus mucosal edema.

## 2021-08-07 MED ORDER — GADOBENATE DIMEGLUMINE 529 MG/ML IV SOLN
19.0000 mL | Freq: Once | INTRAVENOUS | Status: AC | PRN
  Administered 2021-08-07: 19 mL via INTRAVENOUS

## 2021-08-23 ENCOUNTER — Encounter (HOSPITAL_BASED_OUTPATIENT_CLINIC_OR_DEPARTMENT_OTHER): Payer: Self-pay | Admitting: Otolaryngology

## 2021-08-23 ENCOUNTER — Other Ambulatory Visit: Payer: Self-pay

## 2021-08-23 NOTE — Progress Notes (Signed)
Spoke with Nira Conn at Dr. Deeann Saint office requesting aspirin hold clearance from PCP prior to surgery at [facility] on 4/17.  ?

## 2021-08-30 NOTE — Progress Notes (Signed)
Heather at Dr Deeann Saint office called again about ASA hold for surgery. She is awaiting clearance and will fax to me when she gets it. ?

## 2021-09-04 ENCOUNTER — Other Ambulatory Visit: Payer: Self-pay

## 2021-09-04 ENCOUNTER — Ambulatory Visit (HOSPITAL_BASED_OUTPATIENT_CLINIC_OR_DEPARTMENT_OTHER)
Admission: RE | Admit: 2021-09-04 | Discharge: 2021-09-04 | Disposition: A | Discharge status: Home / Self Care | Payer: Medicare Other | Attending: Otolaryngology | Admitting: Otolaryngology

## 2021-09-04 ENCOUNTER — Encounter (HOSPITAL_BASED_OUTPATIENT_CLINIC_OR_DEPARTMENT_OTHER): Payer: Self-pay | Admitting: Otolaryngology

## 2021-09-04 ENCOUNTER — Ambulatory Visit (HOSPITAL_BASED_OUTPATIENT_CLINIC_OR_DEPARTMENT_OTHER): Payer: Medicare Other | Admitting: Certified Registered"

## 2021-09-04 ENCOUNTER — Encounter (HOSPITAL_BASED_OUTPATIENT_CLINIC_OR_DEPARTMENT_OTHER)
Admission: RE | Disposition: A | Discharge status: Home / Self Care | Payer: Self-pay | Source: Home / Self Care | Attending: Otolaryngology

## 2021-09-04 DIAGNOSIS — J3489 Other specified disorders of nose and nasal sinuses: Secondary | ICD-10-CM | POA: Insufficient documentation

## 2021-09-04 DIAGNOSIS — J339 Nasal polyp, unspecified: Secondary | ICD-10-CM | POA: Diagnosis not present

## 2021-09-04 DIAGNOSIS — I10 Essential (primary) hypertension: Secondary | ICD-10-CM | POA: Diagnosis not present

## 2021-09-04 DIAGNOSIS — Z8673 Personal history of transient ischemic attack (TIA), and cerebral infarction without residual deficits: Secondary | ICD-10-CM | POA: Insufficient documentation

## 2021-09-04 DIAGNOSIS — J324 Chronic pansinusitis: Secondary | ICD-10-CM

## 2021-09-04 HISTORY — PX: ETHMOIDECTOMY: SHX5197

## 2021-09-04 HISTORY — PX: SPHENOIDECTOMY: SHX2421

## 2021-09-04 HISTORY — PX: SINUS ENDO WITH FUSION: SHX5329

## 2021-09-04 HISTORY — PX: FRONTAL SINUS EXPLORATION: SHX6591

## 2021-09-04 HISTORY — DX: Cerebral infarction, unspecified: I63.9

## 2021-09-04 HISTORY — DX: Essential (primary) hypertension: I10

## 2021-09-04 HISTORY — DX: Malignant neoplasm of prostate: C61

## 2021-09-04 HISTORY — PX: MAXILLARY ANTROSTOMY: SHX2003

## 2021-09-04 SURGERY — SURGERY, PARANASAL SINUS, ENDOSCOPIC, WITH NASAL SEPTOPLASTY, TURBINOPLASTY, AND MAXILLARY SINUSOTOMY
Anesthesia: General | Site: Nose | Laterality: Bilateral

## 2021-09-04 MED ORDER — AMOXICILLIN 875 MG PO TABS: 875.0000 mg | ORAL_TABLET | Freq: Two times a day (BID) | ORAL | 0 refills | Status: AC

## 2021-09-04 MED ORDER — FENTANYL CITRATE (PF) 100 MCG/2ML IJ SOLN
INTRAMUSCULAR | Status: AC
  Filled 2021-09-04: qty 2

## 2021-09-04 MED ORDER — AMISULPRIDE (ANTIEMETIC) 5 MG/2ML IV SOLN: 10.0000 mg | Freq: Once | INTRAVENOUS | Status: DC | PRN

## 2021-09-04 MED ORDER — MIDAZOLAM HCL 2 MG/2ML IJ SOLN
INTRAMUSCULAR | Status: AC
  Filled 2021-09-04: qty 2

## 2021-09-04 MED ORDER — CEFAZOLIN SODIUM 1 G IJ SOLR
INTRAMUSCULAR | Status: AC
  Filled 2021-09-04: qty 20

## 2021-09-04 MED ORDER — CEFAZOLIN SODIUM-DEXTROSE 2-3 GM-%(50ML) IV SOLR
INTRAVENOUS | Status: DC | PRN
  Administered 2021-09-04: 2 g via INTRAVENOUS

## 2021-09-04 MED ORDER — DEXAMETHASONE SODIUM PHOSPHATE 10 MG/ML IJ SOLN
INTRAMUSCULAR | Status: AC
  Filled 2021-09-04: qty 1

## 2021-09-04 MED ORDER — SUGAMMADEX SODIUM 200 MG/2ML IV SOLN
INTRAVENOUS | Status: DC | PRN
  Administered 2021-09-04: 200 mg via INTRAVENOUS

## 2021-09-04 MED ORDER — OXYMETAZOLINE HCL 0.05 % NA SOLN
NASAL | Status: DC | PRN
  Administered 2021-09-04: 1

## 2021-09-04 MED ORDER — LACTATED RINGERS IV SOLN: INTRAVENOUS | Status: DC

## 2021-09-04 MED ORDER — FENTANYL CITRATE (PF) 100 MCG/2ML IJ SOLN
INTRAMUSCULAR | Status: DC | PRN
Start: 2021-09-04 — End: 2021-09-04
  Administered 2021-09-04: 100 ug via INTRAVENOUS

## 2021-09-04 MED ORDER — LIDOCAINE 2% (20 MG/ML) 5 ML SYRINGE
INTRAMUSCULAR | Status: AC
  Filled 2021-09-04: qty 5

## 2021-09-04 MED ORDER — ONDANSETRON HCL 4 MG/2ML IJ SOLN
INTRAMUSCULAR | Status: DC | PRN
  Administered 2021-09-04: 4 mg via INTRAVENOUS

## 2021-09-04 MED ORDER — ONDANSETRON HCL 4 MG/2ML IJ SOLN
INTRAMUSCULAR | Status: AC
  Filled 2021-09-04: qty 2

## 2021-09-04 MED ORDER — GLYCOPYRROLATE 0.2 MG/ML IJ SOLN
INTRAMUSCULAR | Status: DC | PRN
  Administered 2021-09-04: .1 mg via INTRAVENOUS

## 2021-09-04 MED ORDER — OXYCODONE-ACETAMINOPHEN 5-325 MG PO TABS: 1.0000 | ORAL_TABLET | ORAL | 0 refills | Status: AC | PRN

## 2021-09-04 MED ORDER — KETOROLAC TROMETHAMINE 15 MG/ML IJ SOLN: 15.0000 mg | Freq: Once | INTRAMUSCULAR | Status: DC | PRN

## 2021-09-04 MED ORDER — LIDOCAINE 2% (20 MG/ML) 5 ML SYRINGE
INTRAMUSCULAR | Status: DC | PRN
  Administered 2021-09-04: 60 mg via INTRAVENOUS

## 2021-09-04 MED ORDER — EPHEDRINE 5 MG/ML INJ
INTRAVENOUS | Status: AC
  Filled 2021-09-04: qty 10

## 2021-09-04 MED ORDER — ROCURONIUM BROMIDE 100 MG/10ML IV SOLN
INTRAVENOUS | Status: DC | PRN
Start: 2021-09-04 — End: 2021-09-04
  Administered 2021-09-04: 50 mg via INTRAVENOUS

## 2021-09-04 MED ORDER — PROPOFOL 10 MG/ML IV BOLUS
INTRAVENOUS | Status: AC
  Filled 2021-09-04: qty 20

## 2021-09-04 MED ORDER — ACETAMINOPHEN 10 MG/ML IV SOLN: 1000.0000 mg | Freq: Once | INTRAVENOUS | Status: DC | PRN

## 2021-09-04 MED ORDER — PROPOFOL 10 MG/ML IV BOLUS
INTRAVENOUS | Status: DC | PRN
Start: 2021-09-04 — End: 2021-09-04
  Administered 2021-09-04: 150 mg via INTRAVENOUS

## 2021-09-04 MED ORDER — MIDAZOLAM HCL 5 MG/5ML IJ SOLN
INTRAMUSCULAR | Status: DC | PRN
  Administered 2021-09-04: 1 mg via INTRAVENOUS

## 2021-09-04 MED ORDER — EPHEDRINE SULFATE (PRESSORS) 50 MG/ML IJ SOLN
INTRAMUSCULAR | Status: DC | PRN
  Administered 2021-09-04 (×2): 10 mg via INTRAVENOUS

## 2021-09-04 MED ORDER — DEXAMETHASONE SODIUM PHOSPHATE 10 MG/ML IJ SOLN
INTRAMUSCULAR | Status: DC | PRN
  Administered 2021-09-04: 10 mg via INTRAVENOUS

## 2021-09-04 MED ORDER — ONDANSETRON HCL 4 MG/2ML IJ SOLN: 4.0000 mg | Freq: Once | INTRAMUSCULAR | Status: DC | PRN

## 2021-09-04 MED ORDER — MUPIROCIN 2 % EX OINT
TOPICAL_OINTMENT | CUTANEOUS | Status: DC | PRN
  Administered 2021-09-04: 1 via TOPICAL

## 2021-09-04 MED ORDER — FENTANYL CITRATE (PF) 100 MCG/2ML IJ SOLN: 25.0000 ug | INTRAMUSCULAR | Status: DC | PRN

## 2021-09-04 MED ORDER — SODIUM CHLORIDE 0.9 % IR SOLN
Status: DC | PRN
  Administered 2021-09-04: 1

## 2021-09-04 SURGICAL SUPPLY — 42 items
BLADE ROTATE RAD 12 4 M4 (BLADE) IMPLANT
BLADE ROTATE RAD 40 4 M4 (BLADE) IMPLANT
BLADE ROTATE TRICUT 4X13 M4 (BLADE) ×2 IMPLANT
BUR HS RAD FRONTAL 3 (BURR) IMPLANT
CANISTER SUC SOCK COL 7IN (MISCELLANEOUS) ×3 IMPLANT
CANISTER SUCT 1200ML W/VALVE (MISCELLANEOUS) ×4 IMPLANT
COAGULATOR SUCT 8FR VV (MISCELLANEOUS) IMPLANT
DEFOGGER MIRROR 1QT (MISCELLANEOUS) ×2 IMPLANT
DRSG NASAL KENNEDY LMNT 8CM (GAUZE/BANDAGES/DRESSINGS) IMPLANT
DRSG NASOPORE 8CM (GAUZE/BANDAGES/DRESSINGS) ×1 IMPLANT
ELECT REM PT RETURN 9FT ADLT (ELECTROSURGICAL) ×2
ELECTRODE REM PT RTRN 9FT ADLT (ELECTROSURGICAL) ×1 IMPLANT
GLOVE BIO SURGEON STRL SZ 6.5 (GLOVE) ×1 IMPLANT
GLOVE BIO SURGEON STRL SZ7.5 (GLOVE) ×2 IMPLANT
GLOVE BIOGEL PI IND STRL 7.0 (GLOVE) IMPLANT
GLOVE BIOGEL PI INDICATOR 7.0 (GLOVE) ×1
GOWN STRL REUS W/ TWL LRG LVL3 (GOWN DISPOSABLE) ×2 IMPLANT
GOWN STRL REUS W/TWL LRG LVL3 (GOWN DISPOSABLE) ×4
HEMOSTAT SURGICEL 2X14 (HEMOSTASIS) IMPLANT
IV NS 500ML (IV SOLUTION) ×2
IV NS 500ML BAXH (IV SOLUTION) ×1 IMPLANT
NDL HYPO 25X1 1.5 SAFETY (NEEDLE) IMPLANT
NDL SPNL 25GX3.5 QUINCKE BL (NEEDLE) IMPLANT
NEEDLE HYPO 25X1 1.5 SAFETY (NEEDLE) IMPLANT
NEEDLE SPNL 25GX3.5 QUINCKE BL (NEEDLE) IMPLANT
NS IRRIG 1000ML POUR BTL (IV SOLUTION) ×2 IMPLANT
PACK BASIN DAY SURGERY FS (CUSTOM PROCEDURE TRAY) ×2 IMPLANT
PACK ENT DAY SURGERY (CUSTOM PROCEDURE TRAY) ×2 IMPLANT
SLEEVE SCD COMPRESS KNEE MED (STOCKING) ×2 IMPLANT
SPIKE FLUID TRANSFER (MISCELLANEOUS) IMPLANT
SPONGE GAUZE 2X2 8PLY STRL LF (GAUZE/BANDAGES/DRESSINGS) ×2 IMPLANT
SPONGE NEURO XRAY DETECT 1X3 (DISPOSABLE) ×2 IMPLANT
SUCTION FRAZIER HANDLE 10FR (MISCELLANEOUS)
SUCTION TUBE FRAZIER 10FR DISP (MISCELLANEOUS) IMPLANT
SYR 50ML LL SCALE MARK (SYRINGE) ×2 IMPLANT
TOWEL GREEN STERILE FF (TOWEL DISPOSABLE) ×2 IMPLANT
TRACKER ENT INSTRUMENT (MISCELLANEOUS) ×2 IMPLANT
TRACKER ENT PATIENT (MISCELLANEOUS) ×2 IMPLANT
TUBE CONNECTING 20X1/4 (TUBING) ×2 IMPLANT
TUBE SALEM SUMP 16 FR W/ARV (TUBING) ×1 IMPLANT
TUBING STRAIGHTSHOT EPS 5PK (TUBING) ×2 IMPLANT
YANKAUER SUCT BULB TIP NO VENT (SUCTIONS) ×2 IMPLANT

## 2021-09-04 NOTE — Op Note (Signed)
DATE OF PROCEDURE: 09/04/2021 ? ?OPERATIVE REPORT  ? ?SURGEON: Leta Baptist, MD  ? ?PREOPERATIVE DIAGNOSES:  ?1.  Lateral chronic pansinusitis and polyposis ?2.  Bilateral chronic nasal obstruction ? ?POSTOPERATIVE DIAGNOSES:  ?1.  Lateral chronic pansinusitis and polyposis ?2.  Bilateral chronic nasal obstruction ? ?PROCEDURE PERFORMED:  ?1.  Bilateral endoscopic total ethmoidectomy and sphenoidotomy with polyp removal ?2.  Bilateral endoscopic frontal sinusotomy with polyp removal ?3.  Bilateral endoscopic maxillary antrostomy with polyp removal ?4.  FUSION stereotactic image guidance ? ?ANESTHESIA: General endotracheal tube anesthesia.  ? ?COMPLICATIONS: None.  ? ?ESTIMATED BLOOD LOSS: 600 mL.  ? ?INDICATION FOR PROCEDURE: Brian Bush is a 71 y.o. male with a history of bilateral chronic rhinosinusitis and polyposis.  He previously underwent 2 sinus surgeries to remove his nasal polyps.  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  He was previously treated with multiple courses of antibiotics, high-dose prednisone, and allergy medications.  The patient reported persistent nasal obstruction.  His recent CT scan showed opacifications of all paranasal sinuses.  Based on the above findings, the decision was made for the patient to undergo the above-stated procedures. The risks, benefits, alternatives, and details of the procedures were discussed with the patient. Questions were invited and answered. Informed consent was obtained.  ? ?DESCRIPTION OF PROCEDURE: The patient was taken to the operating room and placed supine on the operating table. General endotracheal tube anesthesia was administered by the anesthesiologist. The patient was positioned, and prepped and draped in the standard fashion for nasal surgery. Pledgets soaked with Afrin were placed in both nasal cavities for decongestion. The pledgets were subsequently removed. The FUSION  stereotactic image guidance marker was placed. The image guidance system was functional throughout the case. ? ?Using a 0? endoscope, the left nasal cavity was examined. A large middle turbinate was noted. Using Tru-Cut forceps, the inferior one third of the middle turbinate was resected. Polypoid tissue was noted within the middle meatus. The polypoid tissue was removed using a combination of microdebrider and Blakesley forceps. The maxillary antrum was entered and enlarged using a combination of backbiter and microdebrider. Polypoid tissue was removed from the left maxillary sinus.  Attention was then focused on the ethmoid sinuses. The bony partitions of the anterior and posterior ethmoid cavities were taken down. Polypoid tissue was noted and removed.  More polyps were noted to obstruct the left sphenoid opening. The polyps were removed. The sphenoid opening was entered and enlarged. More polyps were removed. Attention was then focused on the frontal sinus. The frontal recess was identified and enlarged by removing the surrounding bony partitions. Polypoid tissue was removed from the frontal recess. All 4 paranasal sinuses were copiously irrigated with saline solution. ? ?The same procedure was repeated on the right side without exception. More polyps were noted on the right side. All polyps were removed.  ? ?The care of the patient was turned over to the anesthesiologist. The patient was awakened from anesthesia without difficulty. The patient was extubated and transferred to the recovery room in good condition.  ? ?OPERATIVE FINDINGS: Bilateral chronic pansinusitis and polyposis. ? ?SPECIMEN: Bilateral sinus contents ? ?FOLLOWUP CARE: The patient be discharged home once he is awake and alert. The patient will be placed on Percocet p.r.n. pain, and amoxicillin 875 mg p.o. b.i.d. for 3 days. The patient will follow up in my office in 1 week. ? ?Brian Freas Raynelle Bring, MD  ?

## 2021-09-04 NOTE — Anesthesia Preprocedure Evaluation (Addendum)
Anesthesia Evaluation  ?Patient identified by MRN, date of birth, ID band ?Patient awake ? ? ? ?Reviewed: ?Allergy & Precautions, NPO status , Patient's Chart, lab work & pertinent test results ? ?Airway ?Mallampati: II ? ?TM Distance: >3 FB ?Neck ROM: Full ? ? ? Dental ?no notable dental hx. ? ?  ?Pulmonary ?neg pulmonary ROS,  ?  ?Pulmonary exam normal ? ? ? ? ? ? ? Cardiovascular ?hypertension, Pt. on home beta blockers ?Normal cardiovascular exam ? ? ?  ?Neuro/Psych ?CVA negative psych ROS  ? GI/Hepatic ?negative GI ROS, Neg liver ROS,   ?Endo/Other  ?negative endocrine ROS ? Renal/GU ?negative Renal ROS  ? ?  ?Musculoskeletal ?negative musculoskeletal ROS ?(+)  ? Abdominal ?  ?Peds ? Hematology ? ?(+) REFUSES BLOOD PRODUCTS,   ?Anesthesia Other Findings ?MAXILLARY SINUSITIS  ?FRONTAL SINUSITIS  ?ETHMOID SINUSITIS  ?SPHENOID SINUSITIS ? Reproductive/Obstetrics ? ?  ? ? ? ? ? ? ? ? ? ? ? ? ? ?  ?  ? ? ? ? ? ? ? ?Anesthesia Physical ?Anesthesia Plan ? ?ASA: 2 ? ?Anesthesia Plan: General  ? ?Post-op Pain Management:   ? ?Induction: Intravenous ? ?PONV Risk Score and Plan: 2 and Ondansetron, Dexamethasone and Treatment may vary due to age or medical condition ? ?Airway Management Planned: Oral ETT ? ?Additional Equipment:  ? ?Intra-op Plan:  ? ?Post-operative Plan: Extubation in OR ? ?Informed Consent: I have reviewed the patients History and Physical, chart, labs and discussed the procedure including the risks, benefits and alternatives for the proposed anesthesia with the patient or authorized representative who has indicated his/her understanding and acceptance.  ? ? ? ?Dental advisory given and Interpreter used for interveiw ? ?Plan Discussed with: CRNA ? ?Anesthesia Plan Comments:   ? ? ? ? ? ? ?Anesthesia Quick Evaluation ? ?

## 2021-09-04 NOTE — Anesthesia Procedure Notes (Signed)
Procedure Name: Intubation ?Date/Time: 09/04/2021 9:58 AM ?Performed by: Lavonia Dana, CRNA ?Pre-anesthesia Checklist: Patient identified, Emergency Drugs available, Suction available and Patient being monitored ?Patient Re-evaluated:Patient Re-evaluated prior to induction ?Oxygen Delivery Method: Circle system utilized ?Preoxygenation: Pre-oxygenation with 100% oxygen ?Induction Type: IV induction ?Ventilation: Mask ventilation without difficulty ?Laryngoscope Size: Mac and 4 ?Grade View: Grade II ?Tube type: Oral ?Tube size: 7.5 mm ?Number of attempts: 1 ?Airway Equipment and Method: Stylet and Oral airway ?Placement Confirmation: ETT inserted through vocal cords under direct vision, positive ETCO2 and breath sounds checked- equal and bilateral ?Secured at: 23 cm ?Tube secured with: Tape ?Dental Injury: Teeth and Oropharynx as per pre-operative assessment  ? ? ? ? ?

## 2021-09-04 NOTE — H&P (Signed)
Cc: Chronic rhinosinusitis, nasal polyps ? ?HPI: ?The patient is a 71 year old male who returns today for his follow-up evaluation.  The patient was last seen 3 months ago.  The patient has a history of bilateral chronic rhinosinusitis and polyposis.  He previously underwent 2 sinus surgeries to remove his nasal polyps.  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  He was previously treated with multiple courses of high-dose prednisone and allergy medications.  The patient returns today complaining of persistent nasal obstruction.  He has a recent CT scan showed opacifications of all paranasal sinuses.  He is a habitual mouth breather.  He denies any fever or visual change. ? ?Exam: ?General: Communicates without difficulty, well nourished, no acute distress. Head: Normocephalic, no evidence injury, no tenderness, facial buttresses intact without stepoff. Eyes: PERRL, EOMI. No scleral icterus, conjunctivae clear. Neuro: CN II exam reveals vision grossly intact.  No nystagmus at any point of gaze. Ears: Auricles well formed without lesions.  Ear canals are intact without mass or lesion.  No erythema or edema is appreciated.  The TMs are intact without fluid. Nose: External evaluation reveals normal support and skin without lesions.  Dorsum is intact.  Anterior rhinoscopy reveals congested and edematous mucosa over anterior aspect of the inferior turbinates and nasal septum.  No purulence is noted. Middle meatus is not well visualized. Oral:  Oral cavity and oropharynx are intact, symmetric, without erythema or edema.  Mucosa is moist without lesions. Neck: Full range of motion without pain.  There is no significant lymphadenopathy.  No masses palpable.  Thyroid bed within normal limits to palpation.  Parotid glands and submandibular glands equal bilaterally without mass.  Trachea is midline. Neuro:  CN 2-12 grossly intact. Gait normal.  Vestibular: No nystagmus at any point of gaze. A flexible scope was inserted into the right nasal cavity.  Endoscopy of the interior nasal cavity, superior, inferior, and middle meatus was performed. The sphenoid-ethmoid recess was examined. Edematous mucosa was noted.  Polypoid tissue was noted to obstruct the middle meatus and the superior nasal cavity. Olfactory cleft was obstructed.  Nasopharynx was clear.  Turbinates were hypertrophied but without mass. The procedure was repeated on the contralateral side with similar findings.   ? ?Assessment: ?1.  Bilateral chronic rhinosinusitis and polyposis.  All 4 pairs of paranasal sinuses are noted to be involved.  The size of the polyps have increased since his last visit 3 months ago. ?2.  Chronic nasal obstruction. ? ? ?Plan: ?1.  The nasal endoscopy findings and the CT results are reviewed with the patient. ?2.  Continue with Flonase nasal spray daily. ?3.  Based on the above findings, the patient may benefit from undergoing revision endoscopic sinus surgery to remove the polyposis.  The risk, benefits, and details of the procedures discussed.  Questions are invited and answered. ?4.  The patient would like to proceed with the procedures. ?

## 2021-09-04 NOTE — Transfer of Care (Signed)
Immediate Anesthesia Transfer of Care Note ? ?Patient: Brian Bush ? ?Procedure(s) Performed: SINUS ENDOSCOPY  WITH STEALTH NAVIGATION (Bilateral: Nose) ?MAXILLARY ANTROSTOMY WITH TISSUE REMOVAL (Bilateral: Nose) ?FRONTAL RECESS EXPLORATION (Bilateral: Nose) ?BILATERAL TOTAL ETHMOIDECTOMY (Bilateral: Nose) ?SPHENOIDECTOMY WITH TISSUE REMOVAL (Bilateral: Nose) ? ?Patient Location: PACU ? ?Anesthesia Type:General ? ?Level of Consciousness: drowsy ? ?Airway & Oxygen Therapy: Patient Spontanous Breathing and Patient connected to face mask oxygen ? ?Post-op Assessment: Report given to RN and Post -op Vital signs reviewed and stable ? ?Post vital signs: Reviewed and stable ? ?Last Vitals:  ?Vitals Value Taken Time  ?BP    ?Temp    ?Pulse 57 09/04/21 1131  ?Resp 11 09/04/21 1131  ?SpO2 99 % 09/04/21 1131  ?Vitals shown include unvalidated device data. ? ?Last Pain:  ?Vitals:  ? 09/04/21 0808  ?TempSrc: Oral  ?PainSc: 0-No pain  ?   ? ?Patients Stated Pain Goal: 3 (09/04/21 6546) ? ?Complications: No notable events documented. ?

## 2021-09-04 NOTE — Anesthesia Postprocedure Evaluation (Signed)
Anesthesia Post Note ? ?Patient: Jaimie Redditt ? ?Procedure(s) Performed: SINUS ENDOSCOPY  WITH STEALTH NAVIGATION (Bilateral: Nose) ?MAXILLARY ANTROSTOMY WITH TISSUE REMOVAL (Bilateral: Nose) ?FRONTAL RECESS EXPLORATION (Bilateral: Nose) ?BILATERAL TOTAL ETHMOIDECTOMY (Bilateral: Nose) ?SPHENOIDECTOMY WITH TISSUE REMOVAL (Bilateral: Nose) ? ?  ? ?Patient location during evaluation: PACU ?Anesthesia Type: General ?Level of consciousness: awake ?Pain management: pain level controlled ?Vital Signs Assessment: post-procedure vital signs reviewed and stable ?Respiratory status: spontaneous breathing, nonlabored ventilation, respiratory function stable and patient connected to nasal cannula oxygen ?Cardiovascular status: blood pressure returned to baseline and stable ?Postop Assessment: no apparent nausea or vomiting ?Anesthetic complications: no ? ? ?No notable events documented. ? ?Last Vitals:  ?Vitals:  ? 09/04/21 1213 09/04/21 1251  ?BP:  (!) 181/88  ?Pulse: (!) 57 60  ?Resp: 12 16  ?Temp:  36.4 ?C  ?SpO2: 98% 95%  ?  ?Last Pain:  ?Vitals:  ? 09/04/21 1251  ?TempSrc:   ?PainSc: 0-No pain  ? ? ?  ?  ?  ?  ?  ?  ? ?Daven Pinckney P Demarea Lorey ? ? ? ? ?

## 2021-09-04 NOTE — Discharge Instructions (Addendum)

## 2021-09-05 ENCOUNTER — Encounter (HOSPITAL_BASED_OUTPATIENT_CLINIC_OR_DEPARTMENT_OTHER): Payer: Self-pay | Admitting: Otolaryngology

## 2021-09-05 LAB — SURGICAL PATHOLOGY

## 2022-02-14 ENCOUNTER — Encounter: Payer: Self-pay | Admitting: Internal Medicine

## 2022-03-19 ENCOUNTER — Ambulatory Visit (INDEPENDENT_AMBULATORY_CARE_PROVIDER_SITE_OTHER): Payer: Medicare Other | Admitting: Neurology

## 2022-03-19 ENCOUNTER — Encounter: Payer: Self-pay | Admitting: Neurology

## 2022-03-19 VITALS — BP 131/80 | HR 60 | Ht 71.0 in | Wt 197.0 lb

## 2022-03-19 DIAGNOSIS — G500 Trigeminal neuralgia: Secondary | ICD-10-CM

## 2022-03-19 MED ORDER — CARBAMAZEPINE ER 100 MG PO CP12
100.0000 mg | ORAL_CAPSULE | Freq: Two times a day (BID) | ORAL | 3 refills | Status: DC
Start: 2022-03-19 — End: 2023-03-20

## 2022-03-19 NOTE — Patient Instructions (Signed)
Continue carbamazepine '100mg'$  twice daily  Return to clinic in 1 year

## 2022-03-19 NOTE — Progress Notes (Signed)
Follow-up Visit   Date: 03/19/2022    Brian Bush MRN: [id number] DOB: 1950-11-17    Brian Bush is a 71 y.o. right-handed male with  stroke (2016, no residual deficits), hyperlipidemia, and prostate cancer (2022) returning to the clinic for follow-up of right trigeminal neuralgia.  The patient was accompanied to the clinic by wife who also provides collateral information.    IMPRESSION/PLAN: Longstanding history of right trigeminal neuralgia x 25 years.  MRI shows atrophy of the right trigeminal nerve, no compressive pathology.  His pain is better and we have tapered down on his carbamazepine from '300mg'$  twice daily to '100mg'$  twice daily.  I will keep him on low dose carbamazepine '100mg'$  BID.  Return to clinic in 1 year  --------------------------------------------- History of present illness: He has history of right trigeminal neuralgia for the past 25 years.  Symptoms were being management by his PCP with carbamazepine 400-'600mg'$ /d.  He has not seen a neurologist for this.  Pain is described as strong, electrical pain over the right forehead, cheek and into the teeth.  Pain does not cross to the left side.  It lasts about a minute and worse at night time.  It is triggered by sun, light pressure, such as wearing a mask.  He has noticed that since he stopped working, pain occurs less frequently.  Over the years, he has episodic spells pain which has been controlled on carbamazepine '200mg'$  BID, however last week, pain intensified and he increased it to '300mg'$  BID, which has helped.  He has not been on any other medication for pain.   UPDATE 03/19/2022:  He is here for follow-up visit.  He has been on carbamazepine '100mg'$  BID since his last visit and reports that pain remains well-controlled.  He continues to have episodic spells of pain, but this is infrequent and short-lasting. No difficulty with chewing, brushing, or talking.  He has not needed to increase the dose. No new complaints.    Medications:  Current Outpatient Medications on File Prior to Visit  Medication Sig Dispense Refill   carbamazepine (CARBATROL) 100 MG 12 hr capsule Take 1 capsule (100 mg total) by mouth 2 (two) times daily. 180 capsule 3   Cetirizine HCl (ZYRTEC ALLERGY PO) Take by mouth.     propranolol (INDERAL) 80 MG tablet Take 80 mg by mouth daily.     rosuvastatin (CRESTOR) 40 MG tablet Take 1 tablet (40 mg total) by mouth daily. 90 tablet 3   tamsulosin (FLOMAX) 0.4 MG CAPS capsule Take 0.4 mg by mouth daily.     No current facility-administered medications on file prior to visit.    Allergies:  Allergies  Allergen Reactions   Whole Blood Other (See Comments)    Pt is a Jehovah's witness and wishes to not receive blood products.     Vital Signs:  BP 131/80   Pulse 60   Ht '5\' 11"'$  (1.803 m)   Wt 197 lb (89.4 kg)   SpO2 96%   BMI 27.48 kg/m    Neurological Exam: MENTAL STATUS including orientation to time, place, person, recent and remote memory, attention span and concentration, language, and fund of knowledge is normal.  Speech is not dysarthric.  CRANIAL NERVES:  No visual field defects.  Pupils equal round and reactive to light.  Normal conjugate, extra-ocular eye movements in all directions of gaze.  No ptosis.  Face is symmetric. Palate elevates symmetrically.  Tongue is midline.  MOTOR:  Motor strength is 5/5 in  all extremities.  No atrophy, fasciculations or abnormal movements.  No pronator drift.  Tone is normal.    MSRs:  Reflexes are 2+/4 throughout.  SENSORY:  Intact to vibration throughout.  COORDINATION/GAIT:  Normal finger-to- nose-finger.  Intact rapid alternating movements bilaterally.  Gait narrow based and stable.   Data: MRI face trigeminal nerve 08/09/2021: 1. 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 No mass lesion. No vascular impingement. 2. Chronic infarct right paramedian pons 3. Moderate paranasal sinus  mucosal edema.    Thank you for allowing me to participate in patient's care.  If I can answer any additional questions, I would be pleased to do so.    Sincerely,    Rodney Wigger K. Posey Pronto, DO

## 2022-03-26 ENCOUNTER — Ambulatory Visit (INDEPENDENT_AMBULATORY_CARE_PROVIDER_SITE_OTHER): Payer: Medicare Other | Admitting: Internal Medicine

## 2022-03-26 ENCOUNTER — Encounter: Payer: Self-pay | Admitting: Internal Medicine

## 2022-03-26 VITALS — BP 130/90 | HR 59 | Wt 200.0 lb

## 2022-03-26 DIAGNOSIS — Z86010 Personal history of colonic polyps: Secondary | ICD-10-CM | POA: Diagnosis not present

## 2022-03-26 DIAGNOSIS — K5900 Constipation, unspecified: Secondary | ICD-10-CM

## 2022-03-26 DIAGNOSIS — Z1211 Encounter for screening for malignant neoplasm of colon: Secondary | ICD-10-CM

## 2022-03-26 MED ORDER — NA SULFATE-K SULFATE-MG SULF 17.5-3.13-1.6 GM/177ML PO SOLN: 1.0000 | ORAL | 0 refills | Status: DC

## 2022-03-26 NOTE — Progress Notes (Signed)
Chief Complaint: Colon cancer screening  HPI : 71 year old male with history of colon polyps, CVA, prostate cancer, and HTN presents to discuss colon cancer screening.  His last colonoscopy was 5 years ago when he was found to have 2 colon polyps. That colonoscopy was done in [place]. He was recommended for 5 year follow up. He states that his last colonoscopy was incomplete because the prep was not adequate. He has some baseline constipation. He has on average one BM every other day. Denies blood in stools, diarrhea, or weight loss. Denies N&V, dysphagia, or ab pain. Denies family history of colon cancer. Denies use of blood thinners. He follows with a PCP and has had recent routine labs done including blood counts and metabolic panel that have been normal. His prostate cancer was stage 1 so he was never treated.   Wt Readings from Last 3 Encounters:  03/26/22 200 lb (90.7 kg)  03/19/22 197 lb (89.4 kg)  09/04/21 195 lb 1.7 oz (88.5 kg)   Past Medical History:  Diagnosis Date   Colon polyp    Diverticulosis    Elevated cholesterol    Hernia cerebri (HCC)    High blood pressure    Hypertension    Prostate cancer ([place])    Stroke [facility])    Past Surgical History:  Procedure Laterality Date   ETHMOIDECTOMY Bilateral 09/04/2021   Procedure: BILATERAL TOTAL ETHMOIDECTOMY;  Surgeon: Leta Baptist, MD;  Location: [place];  Service: ENT;  Laterality: Bilateral;   FRONTAL SINUS EXPLORATION Bilateral 09/04/2021   Procedure: FRONTAL RECESS EXPLORATION;  Surgeon: Leta Baptist, MD;  Location: [place];  Service: ENT;  Laterality: Bilateral;   INGUINAL HERNIA REPAIR Bilateral    MAXILLARY ANTROSTOMY Bilateral 09/04/2021   Procedure: MAXILLARY ANTROSTOMY WITH TISSUE REMOVAL;  Surgeon: Leta Baptist, MD;  Location: [place];  Service: ENT;  Laterality: Bilateral;   POLYPECTOMY     SINUS ENDO WITH FUSION Bilateral 09/04/2021   Procedure: SINUS ENDOSCOPY  WITH  STEALTH NAVIGATION;  Surgeon: Leta Baptist, MD;  Location: [place];  Service: ENT;  Laterality: Bilateral;   SPHENOIDECTOMY Bilateral 09/04/2021   Procedure: SPHENOIDECTOMY WITH TISSUE REMOVAL;  Surgeon: Leta Baptist, MD;  Location: [place];  Service: ENT;  Laterality: Bilateral;   Family History  Problem Relation Age of Onset   Liver cancer Mother    Liver cancer Father    Esophageal cancer Neg Hx    Stomach cancer Neg Hx    Social History   Tobacco Use   Smoking status: Never   Smokeless tobacco: Never  Substance Use Topics   Alcohol use: Never   Drug use: Never   Current Outpatient Medications  Medication Sig Dispense Refill   aspirin EC 81 MG tablet Take 81 mg by mouth daily. Swallow whole.     carbamazepine (CARBATROL) 100 MG 12 hr capsule Take 1 capsule (100 mg total) by mouth 2 (two) times daily. 180 capsule 3   Cetirizine HCl (ZYRTEC ALLERGY PO) Take by mouth.     propranolol (INDERAL) 80 MG tablet Take 80 mg by mouth daily.     tamsulosin (FLOMAX) 0.4 MG CAPS capsule Take 0.4 mg by mouth daily.     rosuvastatin (CRESTOR) 40 MG tablet Take 1 tablet (40 mg total) by mouth daily. (Patient not taking: Reported on 03/26/2022) 90 tablet 3   No current facility-administered medications for this visit.   Allergies  Allergen Reactions  Whole Blood Other (See Comments)    Pt is a Jehovah's witness and wishes to not receive blood products.     Review of Systems: All systems reviewed and negative except where noted in HPI.   Physical Exam: BP (!) 130/90   Pulse (!) 59   Wt 200 lb (90.7 kg)   SpO2 97%   BMI 27.89 kg/m  Constitutional: Pleasant,well-developed, male in no acute distress. HEENT: Normocephalic and atraumatic. Conjunctivae are normal. No scleral icterus. Cardiovascular: Normal rate, regular rhythm.  Pulmonary/chest: Effort normal and breath sounds normal. No wheezing, rales or rhonchi. Abdominal: Soft, nondistended, nontender.  Bowel sounds active throughout. There are no masses palpable. No hepatomegaly. Extremities: No edema Neurological: Alert and oriented to person place and time. Skin: Skin is warm and dry. No rashes noted. Psychiatric: Normal mood and affect. Behavior is normal.  ASSESSMENT AND PLAN: Colon cancer screening History of colon polyps Constipation Patient presents to discuss getting a colonoscopy for polyp surveillance. His last colonoscopy had an inadequate prep so we will plan to do a 2-day preparation this time.  - Colonoscopy [facility] with 2 day prep  Christia Reading, MD

## 2022-03-26 NOTE — Patient Instructions (Addendum)
_______________________________________________________  If you are age 72 or older, your body mass index should be between 23-30. Your Body mass index is 27.89 kg/m. If this is out of the aforementioned range listed, please consider follow up with your Primary Care Provider. ________________________________________________________  The [facility] providers would like to encourage you to use [facility] to communicate with providers for non-urgent requests or questions.  Due to long hold times on the telephone, sending your provider a message by [facility] may be a faster and more efficient way to get a response.  Please allow 48 business hours for a response.  Please remember that this is for non-urgent requests.  _______________________________________________________  Brian Bush have been scheduled for a colonoscopy. Please follow written instructions given to you at your visit today.  Please pick up your prep supplies at the pharmacy within the next 1-3 days. If you use inhalers (even only as needed), please bring them with you on the day of your procedure.  Due to recent changes in healthcare laws, you may see the results of your imaging and laboratory studies on MyChart before your provider has had a chance to review them.  We understand that in some cases there may be results that are confusing or concerning to you. Not all laboratory results come back in the same time frame and the provider may be waiting for multiple results in order to interpret others.  Please give [place] 48 hours in order for your provider to thoroughly review all the results before contacting the office for clarification of your results.   Thank you for entrusting me with your care and choosing [facility].  Dr Lorenso Courier   Si tiene 53 aos o ms, su ndice de masa corporal debe estar entre 5 y 62. Su ndice de masa corporal es de 27,89 kg/m. Si esto est fuera del rango mencionado anteriormente, considere realizar un  seguimiento con su proveedor de Midwife. ________________________________________________________  [employer] de [facility] desean alentarlo a Risk manager MYCHART para comunicarse con los proveedores para solicitudes o preguntas que no sean urgentes. Debido a los Astronomer de espera en el telfono, enviar un mensaje a su proveedor a travs de [employer] puede ser una forma ms rpida y eficiente de obtener una respuesta. Espere 48 horas hbiles para recibir [employer]. Recuerde que esto es para solicitudes no urgentes. _______________________________________________________  Brian Bush programado una colonoscopia. Siga las instrucciones escritas que se le dieron en su visita de hoy. Recoja sus suministros de preparacin en la farmacia dentro de los prximos 1 a 3 das. Si [place] inhaladores (incluso solo cuando sea necesario), trigalos con usted el da de su procedimiento.  Debido a cambios recientes en las leyes de atencin mdica, es posible que vea los [place] de sus estudios de imgenes y de laboratorio en MyChart antes de que su proveedor haya tenido la oportunidad de revisarlos. Entendemos que en algunos casos puede haber resultados que sean confusos o preocupantes para usted. No todos los resultados de laboratorio llegan en el mismo perodo de tiempo y es posible que el proveedor est esperando varios resultados para Primary school teacher. Dnos 68 horas para que su proveedor revise minuciosamente todos los resultados antes de comunicarse con la oficina para Audiological scientist.  Gracias por confiarme su atencin y [name].  Dr. Lorenso Courier

## 2022-04-03 ENCOUNTER — Encounter: Payer: Self-pay | Admitting: Internal Medicine

## 2022-04-10 ENCOUNTER — Ambulatory Visit (AMBULATORY_SURGERY_CENTER): Payer: Medicare Other | Admitting: Internal Medicine

## 2022-04-10 ENCOUNTER — Encounter: Payer: Self-pay | Admitting: Internal Medicine

## 2022-04-10 VITALS — BP 122/74 | HR 52 | Temp 96.0°F | Resp 10 | Ht 71.0 in | Wt 200.0 lb

## 2022-04-10 DIAGNOSIS — Z09 Encounter for follow-up examination after completed treatment for conditions other than malignant neoplasm: Secondary | ICD-10-CM | POA: Diagnosis not present

## 2022-04-10 DIAGNOSIS — D120 Benign neoplasm of cecum: Secondary | ICD-10-CM

## 2022-04-10 DIAGNOSIS — D123 Benign neoplasm of transverse colon: Secondary | ICD-10-CM | POA: Diagnosis not present

## 2022-04-10 DIAGNOSIS — D122 Benign neoplasm of ascending colon: Secondary | ICD-10-CM

## 2022-04-10 DIAGNOSIS — Z860100 Personal history of colon polyps, unspecified: Secondary | ICD-10-CM

## 2022-04-10 MED ORDER — SODIUM CHLORIDE 0.9 % IV SOLN: 500.0000 mL | INTRAVENOUS | Status: DC

## 2022-04-10 NOTE — Progress Notes (Signed)
A and O x3. Report to RN. Tolerated MAC anesthesia well. 

## 2022-04-10 NOTE — Patient Instructions (Signed)
Handout on diverticulosis and polyps given to patient. Await pathology results.  Resume previous diet and continue present medications.  Repeat colonoscopy for surveillance will be determined based off of pathology results.   YOU HAD AN ENDOSCOPIC PROCEDURE TODAY AT [facility]:   Refer to the procedure report that was given to you for any specific questions about what was found during the examination.  If the procedure report does not answer your questions, please call your gastroenterologist to clarify.  If you requested that your care partner not be given the details of your procedure findings, then the procedure report has been included in a sealed envelope for you to review at your convenience later.  YOU SHOULD EXPECT: Some feelings of bloating in the abdomen. Passage of more gas than usual.  Walking can help get rid of the air that was put into your GI tract during the procedure and reduce the bloating. If you had a lower endoscopy (such as a colonoscopy or flexible sigmoidoscopy) you may notice spotting of blood in your stool or on the toilet paper. If you underwent a bowel prep for your procedure, you may not have a normal bowel movement for a few days.  Please Note:  You might notice some irritation and congestion in your nose or some drainage.  This is from the oxygen used during your procedure.  There is no need for concern and it should clear up in a day or so.  SYMPTOMS TO REPORT IMMEDIATELY:  Following lower endoscopy (colonoscopy or flexible sigmoidoscopy):  Excessive amounts of blood in the stool  Significant tenderness or worsening of abdominal pains  Swelling of the abdomen that is new, acute  Fever of 100F or higher  For urgent or emergent issues, a gastroenterologist can be reached at any hour by calling [phone]. Do not use MyChart messaging for urgent concerns.    DIET:  We do recommend a small meal at first, but then you may proceed to your  regular diet.  Drink plenty of fluids but you should avoid alcoholic beverages for 24 hours.  ACTIVITY:  You should plan to take it easy for the rest of today and you should NOT DRIVE or use heavy machinery until tomorrow (because of the sedation medicines used during the test).    FOLLOW UP: Our staff will call the number listed on your records the next business day following your procedure.  We will call around 7:15- 8:00 am to check on you and address any questions or concerns that you may have regarding the information given to you following your procedure. If we do not reach you, we will leave a message.     If any biopsies were taken you will be contacted by phone or by letter within the next 1-3 weeks.  Please call us at [phone] if you have not heard about the biopsies in 3 weeks.    SIGNATURES/CONFIDENTIALITY: You and/or your care partner have signed paperwork which will be entered into your electronic medical record.  These signatures attest to the fact that that the information above on your After Visit Summary has been reviewed and is understood.  Full responsibility of the confidentiality of this discharge information lies with you and/or your care-partner.USTED TUVO UN PROCEDIMIENTO ENDOSCPICO HOY EN EL [facility]:   Lea el informe del procedimiento que se le entreg para cualquier pregunta especfica sobre lo que se Primary school teacher.  Si el informe del examen no responde  a sus preguntas, por favor llame a su gastroenterlogo para aclararlo.  Si usted solicit que no se le den [employer] de lo que se Estate manager/land agent en su procedimiento al [employer] va a cuidar, entonces el informe del procedimiento se ha incluido en un sobre sellado para que usted lo revise despus cuando le sea ms conveniente.   LO QUE PUEDE ESPERAR: Algunas sensaciones de hinchazn en el abdomen.  Puede tener ms gases de lo normal.  El caminar puede ayudarle a eliminar el aire que se le  puso en el tracto gastrointestinal durante el procedimiento y reducir la hinchazn.  Si le hicieron una endoscopia inferior (como una colonoscopia o una sigmoidoscopia flexible), podra notar manchas de sangre en las heces fecales o en el papel higinico.  Si se someti a una preparacin intestinal para su procedimiento, es posible que no tenga una evacuacin intestinal normal durante [employer].   Tenga en cuenta:  Es posible que note un poco de irritacin y congestin en la nariz o algn drenaje.  Esto es debido al oxgeno [employer] durante su procedimiento.  No hay que preocuparse y esto debe desaparecer ms o Scientist, research (medical).   SNTOMAS PARA REPORTAR INMEDIATAMENTE:  Despus de una endoscopia inferior (colonoscopia o sigmoidoscopia flexible):  Cantidades excesivas de sangre en las heces fecales  Sensibilidad significativa o empeoramiento de los dolores abdominales   Hinchazn aguda del abdomen que antes no tena   Fiebre de 100F o ms     Para asuntos urgentes o de Freight forwarder, puede comunicarse con un gastroenterlogo a cualquier hora llamando al [phone].  DIETA:  Recomendamos una comida pequea al principio, pero luego puede continuar con su dieta normal.  Tome muchos lquidos, Teacher, adult education las bebidas alcohlicas durante 24 horas.    ACTIVIDAD:  Debe planear tomarse las cosas con calma por el resto del da y no debe CONDUCIR ni usar maquinaria pesada Programmer, applications (debido a los medicamentos de sedacin utilizados durante el examen).     SEGUIMIENTO: Nuestro personal llamar al nmero que aparece en su historial al siguiente da hbil de su procedimiento para ver cmo se siente y para responder cualquier pregunta o inquietud que pueda tener con respecto a la informacin que se le dio despus del procedimiento. Si no podemos contactarle, le dejaremos un mensaje.  Sin embargo, si se siente bien y no tiene Paediatric nurse, no es necesario que nos devuelva la llamada.  Asumiremos que  ha regresado a sus actividades diarias normales sin incidentes. Si se le tomaron algunas biopsias, le contactaremos por telfono o por carta en las prximas 3 semanas.  Si no ha sabido [employer] biopsias en el transcurso de 3 semanas, por favor llmenos al [phone].   FIRMAS/CONFIDENCIALIDAD: Usted y/o el acompaante que le cuide han firmado documentos que se ingresarn en su historial mdico electrnico.  Estas firmas atestiguan el hecho de que la informacin anterior

## 2022-04-10 NOTE — Op Note (Signed)
[place] Patient Name: Brian Bush Procedure Date: 04/10/2022 1:25 PM MRN: [id number] Endoscopist: Georgian Co , , [id number] Age: 72 Referring MD:  Date of Birth: [birth year] Gender: Male Account #: [id number] Procedure:                Colonoscopy Indications:              High risk colon cancer surveillance: Personal                            history of colonic polyps Medicines:                Monitored Anesthesia Care Procedure:                Pre-Anesthesia Assessment:                           - Prior to the procedure, a History and Physical                            was performed, and patient medications and                            allergies were reviewed. The patient's tolerance of                            previous anesthesia was also reviewed. The risks                            and benefits of the procedure and the sedation                            options and risks were discussed with the patient.                            All questions were answered, and informed consent                            was obtained. Prior Anticoagulants: The patient has                            taken no anticoagulant or antiplatelet agents                            except for aspirin. ASA Grade Assessment: II - A                            patient with mild systemic disease. After reviewing                            the risks and benefits, the patient was deemed in                            satisfactory condition to undergo the procedure.  After obtaining informed consent, the colonoscope                            was passed under direct vision. Throughout the                            procedure, the patient's blood pressure, pulse, and                            oxygen saturations were monitored continuously. The                            CF [id number] #[id number] was introduced through the anus                            and advanced  to the the cecum, identified by                            appendiceal orifice and ileocecal valve. The                            colonoscopy was performed without difficulty. The                            patient tolerated the procedure well. The quality                            of the bowel preparation was good. The ileocecal                            valve, appendiceal orifice, and rectum were                            photographed. Scope In: 1:34:56 PM Scope Out: 1:53:30 PM Scope Withdrawal Time: 0 hours 11 minutes 29 seconds  Total Procedure Duration: 0 hours 18 minutes 34 seconds  Findings:                 Five sessile polyps were found in the transverse                            colon, ascending colon and cecum. The polyps were 3                            to 6 mm in size. These polyps were removed with a                            cold snare. Resection and retrieval were complete.                           Multiple diverticula were found in the sigmoid                            colon, descending colon, transverse colon  and                            ascending colon.                           Non-bleeding internal hemorrhoids were found during                            retroflexion. Complications:            No immediate complications. Estimated Blood Loss:     Estimated blood loss was minimal. Impression:               - Five 3 to 6 mm polyps in the transverse colon, in                            the ascending colon and in the cecum, removed with                            a cold snare. Resected and retrieved.                           - Diverticulosis in the sigmoid colon, in the                            descending colon, in the transverse colon and in                            the ascending colon.                           - Non-bleeding internal hemorrhoids. Recommendation:           - Discharge patient to home (with escort).                           - Await  pathology results.                           - The findings and recommendations were discussed                            with the patient. Dr Georgian Co "Lyndee Leo" Lorenso Courier,  04/10/2022 2:00:47 PM

## 2022-04-10 NOTE — Progress Notes (Signed)
GASTROENTEROLOGY PROCEDURE H&P NOTE   Primary Care Physician: Pcp, No    Reason for Procedure:   Colon cancer screening, history of colon polyps  Plan:    Colonoscopy  Patient is appropriate for endoscopic procedure(s) in the ambulatory ([facility]) setting.  The nature of the procedure, as well as the risks, benefits, and alternatives were carefully and thoroughly reviewed with the patient. Ample time for discussion and questions allowed. The patient understood, was satisfied, and agreed to proceed.     HPI: Brian Bush is a 71 y.o. male who presents for colonoscopy for evaluation of colon cancer screening and history of colon polyps .  Patient was most recently seen in the Gastroenterology Clinic on 03/26/22.  No interval change in medical history since that appointment. Please refer to that note for full details regarding GI history and clinical presentation.   Past Medical History:  Diagnosis Date   Colon polyp    Diverticulosis    Elevated cholesterol    Hernia cerebri (HCC)    High blood pressure    Hypertension    Prostate cancer ([place])    Stroke [facility])     Past Surgical History:  Procedure Laterality Date   ETHMOIDECTOMY Bilateral 09/04/2021   Procedure: BILATERAL TOTAL ETHMOIDECTOMY;  Surgeon: Leta Baptist, MD;  Location: [place];  Service: ENT;  Laterality: Bilateral;   FRONTAL SINUS EXPLORATION Bilateral 09/04/2021   Procedure: FRONTAL RECESS EXPLORATION;  Surgeon: Leta Baptist, MD;  Location: [place];  Service: ENT;  Laterality: Bilateral;   INGUINAL HERNIA REPAIR Bilateral    MAXILLARY ANTROSTOMY Bilateral 09/04/2021   Procedure: MAXILLARY ANTROSTOMY WITH TISSUE REMOVAL;  Surgeon: Leta Baptist, MD;  Location: [place];  Service: ENT;  Laterality: Bilateral;   POLYPECTOMY     SINUS ENDO WITH FUSION Bilateral 09/04/2021   Procedure: SINUS ENDOSCOPY  WITH STEALTH NAVIGATION;  Surgeon: Leta Baptist, MD;  Location: [place];  Service: ENT;  Laterality: Bilateral;   SPHENOIDECTOMY Bilateral 09/04/2021   Procedure: SPHENOIDECTOMY WITH TISSUE REMOVAL;  Surgeon: Leta Baptist, MD;  Location: [place] [place];  Service: ENT;  Laterality: Bilateral;    Prior to Admission medications   Medication Sig Start Date End Date Taking? Authorizing Provider  aspirin EC 81 MG tablet Take 81 mg by mouth daily. Swallow whole.    [provider]  carbamazepine (CARBATROL) 100 MG 12 hr capsule Take 1 capsule (100 mg total) by mouth 2 (two) times daily. 03/19/22   Narda Amber K, DO  Cetirizine HCl (ZYRTEC ALLERGY PO) Take by mouth.    [provider]  Na Sulfate-K Sulfate-Mg Sulf 17.5-3.13-1.6 GM/177ML SOLN Take 1 kit by mouth as directed. 03/26/22   Sharyn Creamer, MD  propranolol (INDERAL) 80 MG tablet Take 80 mg by mouth daily.    [provider]  rosuvastatin (CRESTOR) 40 MG tablet Take 1 tablet (40 mg total) by mouth daily. Patient not taking: Reported on 03/26/2022 07/11/21   Lorretta Harp, MD  tamsulosin (FLOMAX) 0.4 MG CAPS capsule Take 0.4 mg by mouth daily.    [provider]    Current Outpatient Medications  Medication Sig Dispense Refill   aspirin EC 81 MG tablet Take 81 mg by mouth daily. Swallow whole.     carbamazepine (CARBATROL) 100 MG 12 hr capsule Take 1 capsule (100 mg total) by mouth 2 (two) times daily. 180 capsule 3   Cetirizine HCl (ZYRTEC ALLERGY PO) Take by mouth.  Na Sulfate-K Sulfate-Mg Sulf 17.5-3.13-1.6 GM/177ML SOLN Take 1 kit by mouth as directed. 1 mL 0   propranolol (INDERAL) 80 MG tablet Take 80 mg by mouth daily.     rosuvastatin (CRESTOR) 40 MG tablet Take 1 tablet (40 mg total) by mouth daily. (Patient not taking: Reported on 03/26/2022) 90 tablet 3   tamsulosin (FLOMAX) 0.4 MG CAPS capsule Take 0.4 mg by mouth daily.     Current Facility-Administered Medications  Medication Dose Route Frequency Provider Last Rate Last Admin   0.9 %  sodium  chloride infusion  500 mL Intravenous Continuous Sharyn Creamer, MD        Allergies as of 04/10/2022 - Review Complete 04/10/2022  Allergen Reaction Noted   Whole blood Other (See Comments) 07/11/2021    Family History  Problem Relation Age of Onset   Liver cancer Mother    Liver cancer Father    Esophageal cancer Neg Hx    Stomach cancer Neg Hx     Social History   Socioeconomic History   Marital status: Married    Spouse name: Not on file   Number of children: 3   Years of education: Not on file   Highest education level: Not on file  Occupational History   Occupation: retired  Tobacco Use   Smoking status: Never   Smokeless tobacco: Never  Substance and Sexual Activity   Alcohol use: Never   Drug use: Never   Sexual activity: Not on file  Other Topics Concern   Not on file  Social History Narrative   Right Handed   Lives in a one story apartment   Social Determinants of Health   Financial Resource Strain: Not on file  Food Insecurity: Not on file  Transportation Needs: Not on file  Physical Activity: Not on file  Stress: Not on file  Social Connections: Not on file  Intimate Partner Violence: Not on file    Physical Exam: Vital signs in last 24 hours: BP (!) 145/80   Pulse 60   Temp (!) 96 F (35.6 C)   Ht _0  (1.803 m)   Wt 200 lb (90.7 kg)   SpO2 100%   BMI 27.89 kg/m  GEN: NAD EYE: Sclerae anicteric ENT: MMM CV: Non-tachycardic Pulm: No increased WOB GI: Soft NEURO:  Alert & Oriented   Christia Reading, MD [facility]   04/10/2022 12:54 PM

## 2022-04-10 NOTE — Progress Notes (Signed)
Called to room to assist during endoscopic procedure.  Patient ID and intended procedure confirmed with present staff. Received instructions for my participation in the procedure from the performing physician.  

## 2022-04-11 ENCOUNTER — Telehealth: Payer: Self-pay | Admitting: *Deleted

## 2022-04-11 NOTE — Telephone Encounter (Signed)
  Follow up Call-     04/10/2022   12:45 PM  Call back number  Post procedure Call Back phone  # [phone] (wife)  Permission to leave phone message Yes     Patient questions:  Wrong numbers.

## 2022-04-18 ENCOUNTER — Encounter: Payer: Self-pay | Admitting: Internal Medicine

## 2022-05-01 DIAGNOSIS — J324 Chronic pansinusitis: Secondary | ICD-10-CM | POA: Diagnosis not present

## 2022-05-01 DIAGNOSIS — J338 Other polyp of sinus: Secondary | ICD-10-CM | POA: Diagnosis not present

## 2022-05-04 ENCOUNTER — Other Ambulatory Visit: Payer: Self-pay | Admitting: Neurology

## 2022-05-15 DIAGNOSIS — R350 Frequency of micturition: Secondary | ICD-10-CM | POA: Diagnosis not present

## 2022-05-24 DIAGNOSIS — I69354 Hemiplegia and hemiparesis following cerebral infarction affecting left non-dominant side: Secondary | ICD-10-CM | POA: Diagnosis not present

## 2022-05-24 DIAGNOSIS — Z0289 Encounter for other administrative examinations: Secondary | ICD-10-CM | POA: Diagnosis not present

## 2022-05-24 DIAGNOSIS — I10 Essential (primary) hypertension: Secondary | ICD-10-CM | POA: Diagnosis not present

## 2022-05-24 DIAGNOSIS — I700 Atherosclerosis of aorta: Secondary | ICD-10-CM | POA: Diagnosis not present

## 2022-05-24 DIAGNOSIS — M7989 Other specified soft tissue disorders: Secondary | ICD-10-CM | POA: Diagnosis not present

## 2022-08-07 DIAGNOSIS — I10 Essential (primary) hypertension: Secondary | ICD-10-CM | POA: Diagnosis not present

## 2022-08-07 DIAGNOSIS — J339 Nasal polyp, unspecified: Secondary | ICD-10-CM | POA: Diagnosis not present

## 2022-08-07 DIAGNOSIS — Z0289 Encounter for other administrative examinations: Secondary | ICD-10-CM | POA: Diagnosis not present

## 2022-09-04 DIAGNOSIS — Z0289 Encounter for other administrative examinations: Secondary | ICD-10-CM | POA: Diagnosis not present

## 2022-09-04 DIAGNOSIS — I69354 Hemiplegia and hemiparesis following cerebral infarction affecting left non-dominant side: Secondary | ICD-10-CM | POA: Diagnosis not present

## 2022-09-04 DIAGNOSIS — G250 Essential tremor: Secondary | ICD-10-CM | POA: Diagnosis not present

## 2022-09-04 DIAGNOSIS — E785 Hyperlipidemia, unspecified: Secondary | ICD-10-CM | POA: Diagnosis not present

## 2022-09-04 DIAGNOSIS — G500 Trigeminal neuralgia: Secondary | ICD-10-CM | POA: Diagnosis not present

## 2022-09-04 DIAGNOSIS — I10 Essential (primary) hypertension: Secondary | ICD-10-CM | POA: Diagnosis not present

## 2022-09-04 DIAGNOSIS — J339 Nasal polyp, unspecified: Secondary | ICD-10-CM | POA: Diagnosis not present

## 2022-09-26 DIAGNOSIS — J339 Nasal polyp, unspecified: Secondary | ICD-10-CM | POA: Diagnosis not present

## 2022-09-26 DIAGNOSIS — R430 Anosmia: Secondary | ICD-10-CM | POA: Diagnosis not present

## 2022-09-26 DIAGNOSIS — G500 Trigeminal neuralgia: Secondary | ICD-10-CM | POA: Diagnosis not present

## 2022-09-26 DIAGNOSIS — R0981 Nasal congestion: Secondary | ICD-10-CM | POA: Diagnosis not present

## 2022-09-28 ENCOUNTER — Other Ambulatory Visit (HOSPITAL_COMMUNITY): Payer: Self-pay | Admitting: Urology

## 2022-09-28 DIAGNOSIS — C61 Malignant neoplasm of prostate: Secondary | ICD-10-CM

## 2022-10-06 ENCOUNTER — Ambulatory Visit (HOSPITAL_COMMUNITY): Payer: Medicare Other

## 2022-10-16 DIAGNOSIS — H25813 Combined forms of age-related cataract, bilateral: Secondary | ICD-10-CM | POA: Diagnosis not present

## 2022-10-16 DIAGNOSIS — H5203 Hypermetropia, bilateral: Secondary | ICD-10-CM | POA: Diagnosis not present

## 2022-10-16 DIAGNOSIS — H524 Presbyopia: Secondary | ICD-10-CM | POA: Diagnosis not present

## 2022-11-05 DIAGNOSIS — J339 Nasal polyp, unspecified: Secondary | ICD-10-CM | POA: Diagnosis not present

## 2022-11-05 DIAGNOSIS — I10 Essential (primary) hypertension: Secondary | ICD-10-CM | POA: Diagnosis not present

## 2022-11-05 DIAGNOSIS — Z0289 Encounter for other administrative examinations: Secondary | ICD-10-CM | POA: Diagnosis not present

## 2022-11-05 DIAGNOSIS — R6889 Other general symptoms and signs: Secondary | ICD-10-CM | POA: Diagnosis not present

## 2022-11-05 DIAGNOSIS — M545 Low back pain, unspecified: Secondary | ICD-10-CM | POA: Diagnosis not present

## 2022-11-10 ENCOUNTER — Ambulatory Visit (HOSPITAL_COMMUNITY)
Admission: RE | Admit: 2022-11-10 | Discharge: 2022-11-10 | Disposition: A | Discharge status: Home / Self Care | Payer: 59 | Source: Ambulatory Visit | Attending: Urology | Admitting: Urology

## 2022-11-10 DIAGNOSIS — C61 Malignant neoplasm of prostate: Secondary | ICD-10-CM | POA: Diagnosis present

## 2022-11-10 MED ORDER — GADOBUTROL 1 MMOL/ML IV SOLN
9.0000 mL | Freq: Once | INTRAVENOUS | Status: AC | PRN
  Administered 2022-11-10: 9 mL via INTRAVENOUS

## 2022-12-24 DIAGNOSIS — G500 Trigeminal neuralgia: Secondary | ICD-10-CM | POA: Diagnosis not present

## 2022-12-24 DIAGNOSIS — I700 Atherosclerosis of aorta: Secondary | ICD-10-CM | POA: Diagnosis not present

## 2022-12-24 DIAGNOSIS — H6123 Impacted cerumen, bilateral: Secondary | ICD-10-CM | POA: Diagnosis not present

## 2022-12-24 DIAGNOSIS — I69354 Hemiplegia and hemiparesis following cerebral infarction affecting left non-dominant side: Secondary | ICD-10-CM | POA: Diagnosis not present

## 2022-12-24 DIAGNOSIS — E785 Hyperlipidemia, unspecified: Secondary | ICD-10-CM | POA: Diagnosis not present

## 2022-12-24 DIAGNOSIS — I10 Essential (primary) hypertension: Secondary | ICD-10-CM | POA: Diagnosis not present

## 2022-12-24 DIAGNOSIS — Z86010 Personal history of colonic polyps: Secondary | ICD-10-CM | POA: Diagnosis not present

## 2022-12-24 DIAGNOSIS — Z0289 Encounter for other administrative examinations: Secondary | ICD-10-CM | POA: Diagnosis not present

## 2023-02-19 DIAGNOSIS — I10 Essential (primary) hypertension: Secondary | ICD-10-CM | POA: Diagnosis not present

## 2023-02-19 DIAGNOSIS — Z0289 Encounter for other administrative examinations: Secondary | ICD-10-CM | POA: Diagnosis not present

## 2023-02-19 DIAGNOSIS — E785 Hyperlipidemia, unspecified: Secondary | ICD-10-CM | POA: Diagnosis not present

## 2023-02-22 DIAGNOSIS — I10 Essential (primary) hypertension: Secondary | ICD-10-CM | POA: Diagnosis not present

## 2023-02-22 DIAGNOSIS — Z0289 Encounter for other administrative examinations: Secondary | ICD-10-CM | POA: Diagnosis not present

## 2023-03-20 ENCOUNTER — Ambulatory Visit (INDEPENDENT_AMBULATORY_CARE_PROVIDER_SITE_OTHER): Payer: 59 | Admitting: Neurology

## 2023-03-20 ENCOUNTER — Encounter: Payer: Self-pay | Admitting: Neurology

## 2023-03-20 VITALS — BP 132/82 | HR 56 | Ht 71.0 in | Wt 197.0 lb

## 2023-03-20 DIAGNOSIS — G500 Trigeminal neuralgia: Secondary | ICD-10-CM

## 2023-03-20 MED ORDER — CARBAMAZEPINE ER 100 MG PO CP12: ORAL_CAPSULE | ORAL | 3 refills | Status: DC

## 2023-03-20 NOTE — Progress Notes (Signed)
Follow-up Visit   Date: 03/20/2023    Brian Bush MRN: [id number] DOB: 05-14-1951    Brian Bush is a 72 y.o. right-handed male with  stroke (2016, no residual deficits), hyperlipidemia, and prostate cancer (2022) returning to the clinic for follow-up of right trigeminal neuralgia.  The patient was accompanied to the clinic by wife who also provides collateral information.    IMPRESSION/PLAN: Longstanding history of right trigeminal neuralgia x 25 years.  MRI shows atrophy of the right trigeminal nerve, no compressive pathology.  His pain is better and we have tapered down on his carbamazepine from 300mg  twice daily to 100mg  twice daily.  Continue to take carbamazepine 100mg  daily + extra tablet as needed for breakthrough pain  Return to clinic in 1 year   --------------------------------------------- History of present illness: He has history of right trigeminal neuralgia for the past 25 years.  Symptoms were being management by his PCP with carbamazepine 400-600mg /d.  He has not seen a neurologist for this.  Pain is described as strong, electrical pain over the right forehead, cheek and into the teeth.  Pain does not cross to the left side.  It lasts about a minute and worse at night time.  It is triggered by sun, light pressure, such as wearing a mask.  He has noticed that since he stopped working, pain occurs less frequently.  Over the years, he has episodic spells pain which has been controlled on carbamazepine 200mg  BID, however last week, pain intensified and he increased it to 300mg  BID, which has helped.  He has not been on any other medication for pain.   UPDATE 03/19/2022:  He is here for follow-up visit.  He has been on carbamazepine 100mg  BID since his last visit and reports that pain remains well-controlled.  He continues to have episodic spells of pain, but this is infrequent and short-lasting. No difficulty with chewing, brushing, or talking.  He has not needed to  increase the dose. No new complaints.  UPDATE 03/20/2023:  He is here for 1 year visit.  Overall, his right facial pain has been well-controlled, but recently with the change in temperature, he has had more frequent spells of right facial pain. Extreme temperatures tend to aggravate symptoms.  He currently takes carbamazepine 100mg  daily and an extra tablet when he has breakthrough pain.     Medications:  Current Outpatient Medications on File Prior to Visit  Medication Sig Dispense Refill   aspirin EC 81 MG tablet Take 81 mg by mouth daily. Swallow whole.     propranolol (INDERAL) 80 MG tablet Take 80 mg by mouth daily.     rosuvastatin (CRESTOR) 40 MG tablet Take 1 tablet (40 mg total) by mouth daily. 90 tablet 3   tamsulosin (FLOMAX) 0.4 MG CAPS capsule Take 0.4 mg by mouth daily.     Cetirizine HCl (ZYRTEC ALLERGY PO) Take by mouth. (Patient not taking: Reported on 04/10/2022)     No current facility-administered medications on file prior to visit.    Allergies:  Allergies  Allergen Reactions   Whole Blood Other (See Comments)    Pt is a Jehovah's witness and wishes to not receive blood products.     Vital Signs:  BP 132/82   Pulse (!) 56   Ht 5\' 11"  (1.803 m)   Wt 197 lb (89.4 kg)   SpO2 96%   BMI 27.48 kg/m    Neurological Exam: MENTAL STATUS including orientation to time, place, person, recent and remote  memory, attention span and concentration, language, and fund of knowledge is normal.  Speech is not dysarthric.  CRANIAL NERVES:  No visual field defects.  Pupils equal round and reactive to light.  Normal conjugate, extra-ocular eye movements in all directions of gaze.  No ptosis.  Face is symmetric. Palate elevates symmetrically.  Tongue is midline.  MOTOR:  Motor strength is 5/5 in all extremities.  No atrophy, fasciculations or abnormal movements.  No pronator drift.  Tone is normal.    MSRs:  Reflexes are 2+/4 throughout.  SENSORY:  Intact to vibration  throughout.  COORDINATION/GAIT:  Normal finger-to- nose-finger.  Intact rapid alternating movements bilaterally.  Gait narrow based and stable.   Data: MRI face trigeminal nerve 08/09/2021: 1. 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 No mass lesion. No vascular impingement. 2. Chronic infarct right paramedian pons 3. Moderate paranasal sinus mucosal edema.    Thank you for allowing me to participate in patient's care.  If I can answer any additional questions, I would be pleased to do so.    Sincerely,    Atara Paterson K. Allena Katz, DO

## 2023-04-16 DIAGNOSIS — I10 Essential (primary) hypertension: Secondary | ICD-10-CM | POA: Diagnosis not present

## 2023-04-16 DIAGNOSIS — Z0289 Encounter for other administrative examinations: Secondary | ICD-10-CM | POA: Diagnosis not present

## 2023-05-06 DIAGNOSIS — J339 Nasal polyp, unspecified: Secondary | ICD-10-CM | POA: Diagnosis not present

## 2023-05-06 DIAGNOSIS — G500 Trigeminal neuralgia: Secondary | ICD-10-CM | POA: Diagnosis not present

## 2023-05-06 DIAGNOSIS — R430 Anosmia: Secondary | ICD-10-CM | POA: Diagnosis not present

## 2023-05-06 DIAGNOSIS — R0981 Nasal congestion: Secondary | ICD-10-CM | POA: Diagnosis not present

## 2023-06-12 DIAGNOSIS — G500 Trigeminal neuralgia: Secondary | ICD-10-CM | POA: Diagnosis not present

## 2023-06-12 DIAGNOSIS — M7989 Other specified soft tissue disorders: Secondary | ICD-10-CM | POA: Diagnosis not present

## 2023-06-12 DIAGNOSIS — Z7982 Long term (current) use of aspirin: Secondary | ICD-10-CM | POA: Diagnosis not present

## 2023-06-12 DIAGNOSIS — I69354 Hemiplegia and hemiparesis following cerebral infarction affecting left non-dominant side: Secondary | ICD-10-CM | POA: Diagnosis not present

## 2023-06-12 DIAGNOSIS — I10 Essential (primary) hypertension: Secondary | ICD-10-CM | POA: Diagnosis not present

## 2023-06-12 DIAGNOSIS — E785 Hyperlipidemia, unspecified: Secondary | ICD-10-CM | POA: Diagnosis not present

## 2023-10-04 DIAGNOSIS — I700 Atherosclerosis of aorta: Secondary | ICD-10-CM | POA: Diagnosis not present

## 2023-10-04 DIAGNOSIS — Z0001 Encounter for general adult medical examination with abnormal findings: Secondary | ICD-10-CM | POA: Diagnosis not present

## 2023-10-04 DIAGNOSIS — G500 Trigeminal neuralgia: Secondary | ICD-10-CM | POA: Diagnosis not present

## 2023-10-04 DIAGNOSIS — Z1389 Encounter for screening for other disorder: Secondary | ICD-10-CM | POA: Diagnosis not present

## 2023-10-04 DIAGNOSIS — I10 Essential (primary) hypertension: Secondary | ICD-10-CM | POA: Diagnosis not present

## 2024-03-23 ENCOUNTER — Encounter: Payer: Self-pay | Admitting: Neurology

## 2024-03-23 ENCOUNTER — Ambulatory Visit (INDEPENDENT_AMBULATORY_CARE_PROVIDER_SITE_OTHER): Payer: 59 | Admitting: Neurology

## 2024-03-23 VITALS — BP 146/80 | HR 53 | Ht 71.0 in | Wt 198.0 lb

## 2024-03-23 DIAGNOSIS — G500 Trigeminal neuralgia: Secondary | ICD-10-CM | POA: Diagnosis not present

## 2024-03-23 MED ORDER — CARBAMAZEPINE ER 100 MG PO CP12: ORAL_CAPSULE | ORAL | 3 refills | Status: AC

## 2024-03-23 NOTE — Patient Instructions (Signed)
 Increase carbamazepine  to 200mg  twice daily.  OK to take extra dose as needed for severe pain.

## 2024-03-23 NOTE — Progress Notes (Signed)
 Follow-up Visit   Date: 03/23/2024    Brian Bush MRN: [id number] DOB: 02-14-1951    Brian Bush is a 73 y.o. right-handed male with  stroke (2016, no residual deficits), hyperlipidemia, and prostate cancer (2022) returning to the clinic for follow-up of right trigeminal neuralgia.  The patient was accompanied to the clinic by wife who also provides collateral information.    IMPRESSION/PLAN: Longstanding history of right trigeminal neuralgia for 25+ years. MRI shows atrophy of the right trigeminal nerve, no compressive pathology.   Increase carbamazepine  to 200mg  twice daily.  OK to take extra dose as needed for severe pain.  Consider oxcarbazepine going forward  Return to clinic in 6 month   --------------------------------------------- History of present illness: He has history of right trigeminal neuralgia for the past 25 years.  Symptoms were being management by his PCP with carbamazepine  400-600mg /d.  He has not seen a neurologist for this.  Pain is described as strong, electrical pain over the right forehead, cheek and into the teeth.  Pain does not cross to the left side.  It lasts about a minute and worse at night time.  It is triggered by sun, light pressure, such as wearing a mask.  He has noticed that since he stopped working, pain occurs less frequently.  Over the years, he has episodic spells pain which has been controlled on carbamazepine  200mg  BID, however last week, pain intensified and he increased it to 300mg  BID, which has helped.  He has not been on any other medication for pain.   UPDATE 03/19/2022:  He is here for follow-up visit.  He has been on carbamazepine  100mg  BID since his last visit and reports that pain remains well-controlled.  He continues to have episodic spells of pain, but this is infrequent and short-lasting. No difficulty with chewing, brushing, or talking.  He has not needed to increase the dose. No new complaints.  UPDATE 03/20/2023:  He is  here for 1 year visit.  Overall, his right facial pain has been well-controlled, but recently with the change in temperature, he has had more frequent spells of right facial pain. Extreme temperatures tend to aggravate symptoms.  He currently takes carbamazepine  100mg  daily and an extra tablet when he has breakthrough pain.     UPDATE 03/23/2024:  He is here for follow-up visit.  His trigeminal neuralgia was well-controlled until 2-3 weeks ago.  He reports using a nasal spray and noticed symptoms became worse with this.  Since then, he has having increased pain, so increased his carbamazepine  from 100mg  twice daily to 300mg  twice daily for several days, which did help.  He developed some dizziness at the higher dose, so reduced it to 100mg  three times daily.    Medications:  Current Outpatient Medications on File Prior to Visit  Medication Sig Dispense Refill   aspirin EC 81 MG tablet Take 81 mg by mouth daily. Swallow whole.     propranolol (INDERAL) 80 MG tablet Take 80 mg by mouth daily.     rosuvastatin  (CRESTOR ) 40 MG tablet Take 1 tablet (40 mg total) by mouth daily. 90 tablet 3   tamsulosin (FLOMAX) 0.4 MG CAPS capsule Take 0.4 mg by mouth daily.     Cetirizine HCl (ZYRTEC ALLERGY PO) Take by mouth. (Patient not taking: Reported on 03/23/2024)     No current facility-administered medications on file prior to visit.    Allergies:  Allergies  Allergen Reactions   Whole Blood Other (See Comments)  Pt is a Jehovah's witness and wishes to not receive blood products.     Vital Signs:  BP (!) 146/80   Pulse (!) 53   Ht 5' 11 (1.803 m)   Wt 198 lb (89.8 kg)   SpO2 99%   BMI 27.62 kg/m    Neurological Exam: MENTAL STATUS including orientation to time, place, person, recent and remote memory, attention span and concentration, language, and fund of knowledge is normal.  Speech is not dysarthric.  CRANIAL NERVES:  Pupils equal round and reactive to light.  Normal conjugate,  extra-ocular eye movements in all directions of gaze.  No ptosis.  Face is symmetric.  MOTOR:  Motor strength is 5/5 in all extremities.  No atrophy, fasciculations or abnormal movements.  No pronator drift.  Tone is normal.    COORDINATION/GAIT:  Normal finger-to- nose-finger.  Intact rapid alternating movements bilaterally.  Gait narrow based and stable.   Data: MRI face trigeminal nerve 08/09/2021: 1. 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 No mass lesion. No vascular impingement. 2. Chronic infarct right paramedian pons 3. Moderate paranasal sinus mucosal edema.    Thank you for allowing me to participate in patient's care.  If I can answer any additional questions, I would be pleased to do so.    Sincerely,    Melysa Schroyer K. Tobie, DO

## 2024-09-21 ENCOUNTER — Ambulatory Visit: Admitting: Neurology
